# Patient Record
Sex: Male | Born: 1950 | Race: White | Hispanic: No | Marital: Married | Smoking: Never smoker
Health system: Southern US, Community
[De-identification: ages and names within clinical notes are randomized; demographics above are authoritative.]

## PROBLEM LIST (undated history)

## (undated) DIAGNOSIS — N138 Other obstructive and reflux uropathy: Secondary | ICD-10-CM

## (undated) DIAGNOSIS — M1990 Unspecified osteoarthritis, unspecified site: Secondary | ICD-10-CM

## (undated) DIAGNOSIS — M109 Gout, unspecified: Secondary | ICD-10-CM

## (undated) DIAGNOSIS — R972 Elevated prostate specific antigen [PSA]: Secondary | ICD-10-CM

## (undated) DIAGNOSIS — K219 Gastro-esophageal reflux disease without esophagitis: Secondary | ICD-10-CM

## (undated) DIAGNOSIS — M159 Polyosteoarthritis, unspecified: Secondary | ICD-10-CM

## (undated) DIAGNOSIS — N401 Enlarged prostate with lower urinary tract symptoms: Secondary | ICD-10-CM

## (undated) HISTORY — DX: Other obstructive and reflux uropathy: N13.8

## (undated) HISTORY — DX: Gout, unspecified: M10.9

## (undated) HISTORY — DX: Gastro-esophageal reflux disease without esophagitis: K21.9

## (undated) HISTORY — DX: Polyosteoarthritis, unspecified: M15.9

## (undated) HISTORY — PX: CATARACT EXTRACTION: SUR2

## (undated) HISTORY — DX: Unspecified osteoarthritis, unspecified site: M19.90

## (undated) HISTORY — PX: ROTATOR CUFF REPAIR: SHX139

## (undated) HISTORY — DX: Elevated prostate specific antigen (PSA): R97.20

## (undated) HISTORY — DX: Benign prostatic hyperplasia with lower urinary tract symptoms: N13.8

## (undated) HISTORY — PX: APPENDECTOMY: SHX54

## (undated) HISTORY — DX: Other obstructive and reflux uropathy: N40.1

---

## 1970-10-05 HISTORY — PX: SHOULDER SURGERY: SHX246

## 2014-08-02 LAB — HM COLONOSCOPY

## 2014-10-05 HISTORY — PX: EYE SURGERY: SHX253

## 2015-10-31 DIAGNOSIS — H31092 Other chorioretinal scars, left eye: Secondary | ICD-10-CM | POA: Diagnosis not present

## 2015-10-31 DIAGNOSIS — H35413 Lattice degeneration of retina, bilateral: Secondary | ICD-10-CM | POA: Diagnosis not present

## 2015-10-31 DIAGNOSIS — H43811 Vitreous degeneration, right eye: Secondary | ICD-10-CM | POA: Diagnosis not present

## 2015-11-12 DIAGNOSIS — H2513 Age-related nuclear cataract, bilateral: Secondary | ICD-10-CM | POA: Diagnosis not present

## 2015-11-15 DIAGNOSIS — H21562 Pupillary abnormality, left eye: Secondary | ICD-10-CM | POA: Diagnosis not present

## 2015-11-15 DIAGNOSIS — H1031 Unspecified acute conjunctivitis, right eye: Secondary | ICD-10-CM | POA: Diagnosis not present

## 2015-12-12 DIAGNOSIS — H2513 Age-related nuclear cataract, bilateral: Secondary | ICD-10-CM | POA: Diagnosis not present

## 2015-12-20 DIAGNOSIS — Z9889 Other specified postprocedural states: Secondary | ICD-10-CM | POA: Diagnosis not present

## 2015-12-20 DIAGNOSIS — Z79899 Other long term (current) drug therapy: Secondary | ICD-10-CM | POA: Diagnosis not present

## 2015-12-20 DIAGNOSIS — H269 Unspecified cataract: Secondary | ICD-10-CM | POA: Diagnosis not present

## 2015-12-20 DIAGNOSIS — H2512 Age-related nuclear cataract, left eye: Secondary | ICD-10-CM | POA: Diagnosis not present

## 2015-12-23 DIAGNOSIS — Z961 Presence of intraocular lens: Secondary | ICD-10-CM | POA: Diagnosis not present

## 2015-12-23 DIAGNOSIS — H21562 Pupillary abnormality, left eye: Secondary | ICD-10-CM | POA: Diagnosis not present

## 2015-12-23 DIAGNOSIS — H33312 Horseshoe tear of retina without detachment, left eye: Secondary | ICD-10-CM | POA: Diagnosis not present

## 2015-12-23 DIAGNOSIS — H40013 Open angle with borderline findings, low risk, bilateral: Secondary | ICD-10-CM | POA: Diagnosis not present

## 2016-01-03 DIAGNOSIS — H40013 Open angle with borderline findings, low risk, bilateral: Secondary | ICD-10-CM | POA: Diagnosis not present

## 2016-01-03 DIAGNOSIS — Z961 Presence of intraocular lens: Secondary | ICD-10-CM | POA: Diagnosis not present

## 2016-01-03 DIAGNOSIS — H33312 Horseshoe tear of retina without detachment, left eye: Secondary | ICD-10-CM | POA: Diagnosis not present

## 2016-01-03 DIAGNOSIS — H21562 Pupillary abnormality, left eye: Secondary | ICD-10-CM | POA: Diagnosis not present

## 2016-03-25 DIAGNOSIS — L218 Other seborrheic dermatitis: Secondary | ICD-10-CM | POA: Diagnosis not present

## 2016-04-06 DIAGNOSIS — M7661 Achilles tendinitis, right leg: Secondary | ICD-10-CM | POA: Diagnosis not present

## 2016-04-10 DIAGNOSIS — M7062 Trochanteric bursitis, left hip: Secondary | ICD-10-CM | POA: Diagnosis not present

## 2016-04-30 DIAGNOSIS — H35372 Puckering of macula, left eye: Secondary | ICD-10-CM | POA: Diagnosis not present

## 2016-04-30 DIAGNOSIS — H43811 Vitreous degeneration, right eye: Secondary | ICD-10-CM | POA: Diagnosis not present

## 2016-04-30 DIAGNOSIS — H59032 Cystoid macular edema following cataract surgery, left eye: Secondary | ICD-10-CM | POA: Diagnosis not present

## 2016-04-30 DIAGNOSIS — H35413 Lattice degeneration of retina, bilateral: Secondary | ICD-10-CM | POA: Diagnosis not present

## 2016-04-30 DIAGNOSIS — H31092 Other chorioretinal scars, left eye: Secondary | ICD-10-CM | POA: Diagnosis not present

## 2016-05-01 DIAGNOSIS — R972 Elevated prostate specific antigen [PSA]: Secondary | ICD-10-CM | POA: Diagnosis not present

## 2016-05-12 DIAGNOSIS — N401 Enlarged prostate with lower urinary tract symptoms: Secondary | ICD-10-CM | POA: Diagnosis not present

## 2016-05-12 DIAGNOSIS — R972 Elevated prostate specific antigen [PSA]: Secondary | ICD-10-CM | POA: Diagnosis not present

## 2016-05-12 DIAGNOSIS — N5201 Erectile dysfunction due to arterial insufficiency: Secondary | ICD-10-CM | POA: Diagnosis not present

## 2016-06-04 DIAGNOSIS — H31092 Other chorioretinal scars, left eye: Secondary | ICD-10-CM | POA: Diagnosis not present

## 2016-06-04 DIAGNOSIS — H35372 Puckering of macula, left eye: Secondary | ICD-10-CM | POA: Diagnosis not present

## 2016-06-04 DIAGNOSIS — H59032 Cystoid macular edema following cataract surgery, left eye: Secondary | ICD-10-CM | POA: Diagnosis not present

## 2016-06-04 DIAGNOSIS — H43811 Vitreous degeneration, right eye: Secondary | ICD-10-CM | POA: Diagnosis not present

## 2016-06-04 DIAGNOSIS — H35413 Lattice degeneration of retina, bilateral: Secondary | ICD-10-CM | POA: Diagnosis not present

## 2016-07-27 DIAGNOSIS — M150 Primary generalized (osteo)arthritis: Secondary | ICD-10-CM | POA: Diagnosis not present

## 2016-07-27 DIAGNOSIS — M25552 Pain in left hip: Secondary | ICD-10-CM | POA: Diagnosis not present

## 2016-07-27 DIAGNOSIS — M1000 Idiopathic gout, unspecified site: Secondary | ICD-10-CM | POA: Diagnosis not present

## 2016-09-01 DIAGNOSIS — H31092 Other chorioretinal scars, left eye: Secondary | ICD-10-CM | POA: Diagnosis not present

## 2016-09-01 DIAGNOSIS — H35372 Puckering of macula, left eye: Secondary | ICD-10-CM | POA: Diagnosis not present

## 2016-09-01 DIAGNOSIS — H43811 Vitreous degeneration, right eye: Secondary | ICD-10-CM | POA: Diagnosis not present

## 2016-09-01 DIAGNOSIS — H35413 Lattice degeneration of retina, bilateral: Secondary | ICD-10-CM | POA: Diagnosis not present

## 2016-09-21 DIAGNOSIS — D649 Anemia, unspecified: Secondary | ICD-10-CM | POA: Diagnosis not present

## 2016-09-21 DIAGNOSIS — Z0000 Encounter for general adult medical examination without abnormal findings: Secondary | ICD-10-CM | POA: Diagnosis not present

## 2016-09-21 DIAGNOSIS — R972 Elevated prostate specific antigen [PSA]: Secondary | ICD-10-CM | POA: Diagnosis not present

## 2016-10-02 DIAGNOSIS — Z0000 Encounter for general adult medical examination without abnormal findings: Secondary | ICD-10-CM | POA: Diagnosis not present

## 2016-10-02 DIAGNOSIS — N400 Enlarged prostate without lower urinary tract symptoms: Secondary | ICD-10-CM | POA: Diagnosis not present

## 2016-10-02 DIAGNOSIS — R37 Sexual dysfunction, unspecified: Secondary | ICD-10-CM | POA: Diagnosis not present

## 2016-10-02 DIAGNOSIS — D509 Iron deficiency anemia, unspecified: Secondary | ICD-10-CM | POA: Diagnosis not present

## 2016-10-02 DIAGNOSIS — M109 Gout, unspecified: Secondary | ICD-10-CM | POA: Diagnosis not present

## 2016-10-02 DIAGNOSIS — R972 Elevated prostate specific antigen [PSA]: Secondary | ICD-10-CM | POA: Diagnosis not present

## 2016-10-02 DIAGNOSIS — Z23 Encounter for immunization: Secondary | ICD-10-CM | POA: Diagnosis not present

## 2016-10-29 DIAGNOSIS — D509 Iron deficiency anemia, unspecified: Secondary | ICD-10-CM | POA: Diagnosis not present

## 2017-03-04 DIAGNOSIS — H35372 Puckering of macula, left eye: Secondary | ICD-10-CM | POA: Diagnosis not present

## 2017-03-04 DIAGNOSIS — H35352 Cystoid macular degeneration, left eye: Secondary | ICD-10-CM | POA: Diagnosis not present

## 2017-04-27 DIAGNOSIS — M19012 Primary osteoarthritis, left shoulder: Secondary | ICD-10-CM | POA: Diagnosis not present

## 2017-04-27 DIAGNOSIS — M25512 Pain in left shoulder: Secondary | ICD-10-CM | POA: Diagnosis not present

## 2017-04-27 DIAGNOSIS — M7522 Bicipital tendinitis, left shoulder: Secondary | ICD-10-CM | POA: Diagnosis not present

## 2017-05-04 DIAGNOSIS — M7062 Trochanteric bursitis, left hip: Secondary | ICD-10-CM | POA: Diagnosis not present

## 2017-05-04 DIAGNOSIS — M25552 Pain in left hip: Secondary | ICD-10-CM | POA: Diagnosis not present

## 2017-05-04 DIAGNOSIS — M6281 Muscle weakness (generalized): Secondary | ICD-10-CM | POA: Diagnosis not present

## 2017-05-11 DIAGNOSIS — M25552 Pain in left hip: Secondary | ICD-10-CM | POA: Diagnosis not present

## 2017-05-11 DIAGNOSIS — N401 Enlarged prostate with lower urinary tract symptoms: Secondary | ICD-10-CM | POA: Diagnosis not present

## 2017-05-11 DIAGNOSIS — N5201 Erectile dysfunction due to arterial insufficiency: Secondary | ICD-10-CM | POA: Diagnosis not present

## 2017-05-11 DIAGNOSIS — M7062 Trochanteric bursitis, left hip: Secondary | ICD-10-CM | POA: Diagnosis not present

## 2017-05-11 DIAGNOSIS — M6281 Muscle weakness (generalized): Secondary | ICD-10-CM | POA: Diagnosis not present

## 2017-05-11 DIAGNOSIS — R972 Elevated prostate specific antigen [PSA]: Secondary | ICD-10-CM | POA: Diagnosis not present

## 2017-05-13 DIAGNOSIS — M7062 Trochanteric bursitis, left hip: Secondary | ICD-10-CM | POA: Diagnosis not present

## 2017-05-13 DIAGNOSIS — M25552 Pain in left hip: Secondary | ICD-10-CM | POA: Diagnosis not present

## 2017-05-13 DIAGNOSIS — M6281 Muscle weakness (generalized): Secondary | ICD-10-CM | POA: Diagnosis not present

## 2017-05-18 DIAGNOSIS — M6281 Muscle weakness (generalized): Secondary | ICD-10-CM | POA: Diagnosis not present

## 2017-05-18 DIAGNOSIS — M25552 Pain in left hip: Secondary | ICD-10-CM | POA: Diagnosis not present

## 2017-05-18 DIAGNOSIS — M7062 Trochanteric bursitis, left hip: Secondary | ICD-10-CM | POA: Diagnosis not present

## 2017-05-20 DIAGNOSIS — M6281 Muscle weakness (generalized): Secondary | ICD-10-CM | POA: Diagnosis not present

## 2017-05-20 DIAGNOSIS — M7062 Trochanteric bursitis, left hip: Secondary | ICD-10-CM | POA: Diagnosis not present

## 2017-05-20 DIAGNOSIS — M25552 Pain in left hip: Secondary | ICD-10-CM | POA: Diagnosis not present

## 2017-06-01 DIAGNOSIS — M25552 Pain in left hip: Secondary | ICD-10-CM | POA: Diagnosis not present

## 2017-06-01 DIAGNOSIS — M6281 Muscle weakness (generalized): Secondary | ICD-10-CM | POA: Diagnosis not present

## 2017-06-01 DIAGNOSIS — M7062 Trochanteric bursitis, left hip: Secondary | ICD-10-CM | POA: Diagnosis not present

## 2017-06-04 DIAGNOSIS — M6281 Muscle weakness (generalized): Secondary | ICD-10-CM | POA: Diagnosis not present

## 2017-06-04 DIAGNOSIS — M25552 Pain in left hip: Secondary | ICD-10-CM | POA: Diagnosis not present

## 2017-06-04 DIAGNOSIS — M7062 Trochanteric bursitis, left hip: Secondary | ICD-10-CM | POA: Diagnosis not present

## 2017-06-08 DIAGNOSIS — M7062 Trochanteric bursitis, left hip: Secondary | ICD-10-CM | POA: Diagnosis not present

## 2017-06-08 DIAGNOSIS — M25552 Pain in left hip: Secondary | ICD-10-CM | POA: Diagnosis not present

## 2017-06-08 DIAGNOSIS — M6281 Muscle weakness (generalized): Secondary | ICD-10-CM | POA: Diagnosis not present

## 2017-06-10 DIAGNOSIS — M6281 Muscle weakness (generalized): Secondary | ICD-10-CM | POA: Diagnosis not present

## 2017-06-10 DIAGNOSIS — M7062 Trochanteric bursitis, left hip: Secondary | ICD-10-CM | POA: Diagnosis not present

## 2017-06-10 DIAGNOSIS — M25552 Pain in left hip: Secondary | ICD-10-CM | POA: Diagnosis not present

## 2017-06-15 ENCOUNTER — Encounter: Payer: Self-pay | Admitting: Internal Medicine

## 2017-06-15 ENCOUNTER — Ambulatory Visit (INDEPENDENT_AMBULATORY_CARE_PROVIDER_SITE_OTHER): Payer: Medicare Other | Admitting: Internal Medicine

## 2017-06-15 VITALS — BP 118/70 | HR 71 | Temp 98.2°F | Ht 71.25 in | Wt 180.2 lb

## 2017-06-15 DIAGNOSIS — M7062 Trochanteric bursitis, left hip: Secondary | ICD-10-CM | POA: Diagnosis not present

## 2017-06-15 DIAGNOSIS — N138 Other obstructive and reflux uropathy: Secondary | ICD-10-CM | POA: Diagnosis not present

## 2017-06-15 DIAGNOSIS — M159 Polyosteoarthritis, unspecified: Secondary | ICD-10-CM | POA: Insufficient documentation

## 2017-06-15 DIAGNOSIS — Z7189 Other specified counseling: Secondary | ICD-10-CM | POA: Diagnosis not present

## 2017-06-15 DIAGNOSIS — N401 Enlarged prostate with lower urinary tract symptoms: Secondary | ICD-10-CM

## 2017-06-15 DIAGNOSIS — R972 Elevated prostate specific antigen [PSA]: Secondary | ICD-10-CM | POA: Diagnosis not present

## 2017-06-15 DIAGNOSIS — M25552 Pain in left hip: Secondary | ICD-10-CM | POA: Diagnosis not present

## 2017-06-15 DIAGNOSIS — M109 Gout, unspecified: Secondary | ICD-10-CM

## 2017-06-15 DIAGNOSIS — M6281 Muscle weakness (generalized): Secondary | ICD-10-CM | POA: Diagnosis not present

## 2017-06-15 NOTE — Assessment & Plan Note (Addendum)
Does okay with the tamsulosin Takes sildenafil 20mg  daily as well--this has helped (not for sex)

## 2017-06-15 NOTE — Assessment & Plan Note (Signed)
Questionable episode once No problems since being on the probenicid

## 2017-06-15 NOTE — Progress Notes (Signed)
Subjective:    Patient ID: Craig Jackson, male    DOB: [birth year], 66 y.o.   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   No current facility-administered medications on file prior to visit.     No Known Allergies  Past Medical History:  Diagnosis Date  . Arthritis   . BPH with obstruction/lower urinary tract symptoms   . Elevated PSA   . 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 Gout     Past Surgical History:  Procedure Laterality Date  . APPENDECTOMY    . CATARACT EXTRACTION    . EYE SURGERY  2016   detached retina  . ROTATOR CUFF REPAIR    . SHOULDER SURGERY Left 1972   separation    Family History  Problem Relation Age of Onset  . Arthritis Mother   . Depression Sister     Social History   Social History  . Marital status: Married    Spouse name: N/A  . Number of children: 1  . Years of education: N/A   Occupational History  . Materials engineer     retired   Social History Main Topics  . Smoking status: Never Smoker  . Smokeless tobacco: Current User    Types: Chew     Comment: only occasionally  . Alcohol use 1.2 oz/week    2 Cans of beer per week  . Drug use: No  . Sexual activity: No   Other Topics Concern  . Not on file   Social  History Narrative   1 daughter---local   State graduate      Has living will   Wife is health care POA--daughter is alternate   Would accept resuscitation   Not sure about feeding tube   Review of Systems  Constitutional: Negative for fatigue and unexpected weight change.  HENT: Negative for hearing loss and tinnitus.   Eyes: Positive for visual disturbance.  Respiratory: Negative for cough, chest tightness and shortness of breath.   Cardiovascular: Negative for chest pain, palpitations and leg swelling.  Gastrointestinal: Negative for blood in stool and constipation.       Colon done 2015--was negative  Endocrine: Negative for polydipsia and polyuria.  Genitourinary: Positive for difficulty urinating and urgency.       Mild incontinence--works on Kegel (no pad yet)  Musculoskeletal: Positive for arthralgias. Negative for joint swelling.  Skin: Negative for rash.       No suspicious lesions Does see dermatologist  Allergic/Immunologic: Negative for environmental allergies and immunocompromised state.  Neurological: Negative for dizziness, syncope, light-headedness and headaches.  Psychiatric/Behavioral: Negative for dysphoric mood and sleep disturbance. The patient is not nervous/anxious.        Objective:   Physical Exam  Constitutional: He appears well-nourished. No distress.  HENT:  Mouth/Throat: Oropharynx is clear  and moist. No oropharyngeal exudate.  Neck: No thyromegaly present.  Cardiovascular: Normal rate, regular rhythm, normal heart sounds and intact distal pulses.  Exam reveals no gallop.   No murmur heard. Pulmonary/Chest: Effort normal and breath sounds normal. No respiratory distress. He has no wheezes. He has no rales.  Abdominal: Soft. He exhibits no distension. There is no tenderness. There is no rebound and no guarding.  Musculoskeletal: He exhibits no edema or tenderness.  Lymphadenopathy:    He has no cervical adenopathy.  Skin: No rash noted.    Psychiatric: He has a normal mood and affect. His behavior is normal.          Assessment & Plan:

## 2017-06-15 NOTE — Assessment & Plan Note (Signed)
Lingering around 5 for some years Will recheck in December at his yearly

## 2017-06-15 NOTE — Assessment & Plan Note (Signed)
See social history 

## 2017-06-15 NOTE — Assessment & Plan Note (Signed)
Mild in hands Uses the fish oil and chondroitin with success

## 2017-06-21 ENCOUNTER — Telehealth: Payer: Self-pay | Admitting: Internal Medicine

## 2017-06-21 NOTE — Telephone Encounter (Signed)
Pt is requesting labs prior to cpe. He said he has always done labs prior and prefers to it this way. Cpe is sch 10/2017.

## 2017-06-21 NOTE — Telephone Encounter (Signed)
Okay to schedule lab appt 1-2 weeks before the 1/19 appt  I will put in the order later

## 2017-06-22 DIAGNOSIS — M7062 Trochanteric bursitis, left hip: Secondary | ICD-10-CM | POA: Diagnosis not present

## 2017-06-22 DIAGNOSIS — M25552 Pain in left hip: Secondary | ICD-10-CM | POA: Diagnosis not present

## 2017-06-22 DIAGNOSIS — M6281 Muscle weakness (generalized): Secondary | ICD-10-CM | POA: Diagnosis not present

## 2017-07-27 DIAGNOSIS — M25512 Pain in left shoulder: Secondary | ICD-10-CM | POA: Diagnosis not present

## 2017-07-27 DIAGNOSIS — M75111 Incomplete rotator cuff tear or rupture of right shoulder, not specified as traumatic: Secondary | ICD-10-CM | POA: Diagnosis not present

## 2017-07-27 DIAGNOSIS — M75112 Incomplete rotator cuff tear or rupture of left shoulder, not specified as traumatic: Secondary | ICD-10-CM | POA: Diagnosis not present

## 2017-08-04 DIAGNOSIS — Z23 Encounter for immunization: Secondary | ICD-10-CM | POA: Diagnosis not present

## 2017-08-12 DIAGNOSIS — M25612 Stiffness of left shoulder, not elsewhere classified: Secondary | ICD-10-CM | POA: Diagnosis not present

## 2017-08-12 DIAGNOSIS — M25611 Stiffness of right shoulder, not elsewhere classified: Secondary | ICD-10-CM | POA: Diagnosis not present

## 2017-08-12 DIAGNOSIS — M25512 Pain in left shoulder: Secondary | ICD-10-CM | POA: Diagnosis not present

## 2017-08-12 DIAGNOSIS — M25511 Pain in right shoulder: Secondary | ICD-10-CM | POA: Diagnosis not present

## 2017-08-12 DIAGNOSIS — M6281 Muscle weakness (generalized): Secondary | ICD-10-CM | POA: Diagnosis not present

## 2017-08-17 DIAGNOSIS — M25511 Pain in right shoulder: Secondary | ICD-10-CM | POA: Diagnosis not present

## 2017-08-17 DIAGNOSIS — M6281 Muscle weakness (generalized): Secondary | ICD-10-CM | POA: Diagnosis not present

## 2017-08-17 DIAGNOSIS — M25612 Stiffness of left shoulder, not elsewhere classified: Secondary | ICD-10-CM | POA: Diagnosis not present

## 2017-08-17 DIAGNOSIS — M25611 Stiffness of right shoulder, not elsewhere classified: Secondary | ICD-10-CM | POA: Diagnosis not present

## 2017-08-17 DIAGNOSIS — M25512 Pain in left shoulder: Secondary | ICD-10-CM | POA: Diagnosis not present

## 2017-08-19 DIAGNOSIS — M25512 Pain in left shoulder: Secondary | ICD-10-CM | POA: Diagnosis not present

## 2017-08-19 DIAGNOSIS — M25511 Pain in right shoulder: Secondary | ICD-10-CM | POA: Diagnosis not present

## 2017-08-19 DIAGNOSIS — M25611 Stiffness of right shoulder, not elsewhere classified: Secondary | ICD-10-CM | POA: Diagnosis not present

## 2017-08-19 DIAGNOSIS — M25612 Stiffness of left shoulder, not elsewhere classified: Secondary | ICD-10-CM | POA: Diagnosis not present

## 2017-08-19 DIAGNOSIS — M6281 Muscle weakness (generalized): Secondary | ICD-10-CM | POA: Diagnosis not present

## 2017-08-25 DIAGNOSIS — M25612 Stiffness of left shoulder, not elsewhere classified: Secondary | ICD-10-CM | POA: Diagnosis not present

## 2017-08-25 DIAGNOSIS — M25611 Stiffness of right shoulder, not elsewhere classified: Secondary | ICD-10-CM | POA: Diagnosis not present

## 2017-08-25 DIAGNOSIS — M25511 Pain in right shoulder: Secondary | ICD-10-CM | POA: Diagnosis not present

## 2017-08-25 DIAGNOSIS — M25512 Pain in left shoulder: Secondary | ICD-10-CM | POA: Diagnosis not present

## 2017-08-25 DIAGNOSIS — M6281 Muscle weakness (generalized): Secondary | ICD-10-CM | POA: Diagnosis not present

## 2017-08-31 DIAGNOSIS — M25511 Pain in right shoulder: Secondary | ICD-10-CM | POA: Diagnosis not present

## 2017-08-31 DIAGNOSIS — M25512 Pain in left shoulder: Secondary | ICD-10-CM | POA: Diagnosis not present

## 2017-08-31 DIAGNOSIS — M25612 Stiffness of left shoulder, not elsewhere classified: Secondary | ICD-10-CM | POA: Diagnosis not present

## 2017-08-31 DIAGNOSIS — M25611 Stiffness of right shoulder, not elsewhere classified: Secondary | ICD-10-CM | POA: Diagnosis not present

## 2017-09-02 DIAGNOSIS — M25612 Stiffness of left shoulder, not elsewhere classified: Secondary | ICD-10-CM | POA: Diagnosis not present

## 2017-09-02 DIAGNOSIS — M25611 Stiffness of right shoulder, not elsewhere classified: Secondary | ICD-10-CM | POA: Diagnosis not present

## 2017-09-02 DIAGNOSIS — M25511 Pain in right shoulder: Secondary | ICD-10-CM | POA: Diagnosis not present

## 2017-09-02 DIAGNOSIS — H35413 Lattice degeneration of retina, bilateral: Secondary | ICD-10-CM | POA: Diagnosis not present

## 2017-09-02 DIAGNOSIS — H3581 Retinal edema: Secondary | ICD-10-CM | POA: Diagnosis not present

## 2017-09-02 DIAGNOSIS — H35372 Puckering of macula, left eye: Secondary | ICD-10-CM | POA: Diagnosis not present

## 2017-09-02 DIAGNOSIS — M25512 Pain in left shoulder: Secondary | ICD-10-CM | POA: Diagnosis not present

## 2017-09-07 DIAGNOSIS — M6281 Muscle weakness (generalized): Secondary | ICD-10-CM | POA: Diagnosis not present

## 2017-09-07 DIAGNOSIS — M25612 Stiffness of left shoulder, not elsewhere classified: Secondary | ICD-10-CM | POA: Diagnosis not present

## 2017-09-07 DIAGNOSIS — M25511 Pain in right shoulder: Secondary | ICD-10-CM | POA: Diagnosis not present

## 2017-09-07 DIAGNOSIS — M25512 Pain in left shoulder: Secondary | ICD-10-CM | POA: Diagnosis not present

## 2017-09-07 DIAGNOSIS — M25611 Stiffness of right shoulder, not elsewhere classified: Secondary | ICD-10-CM | POA: Diagnosis not present

## 2017-09-09 DIAGNOSIS — M25611 Stiffness of right shoulder, not elsewhere classified: Secondary | ICD-10-CM | POA: Diagnosis not present

## 2017-09-09 DIAGNOSIS — M25511 Pain in right shoulder: Secondary | ICD-10-CM | POA: Diagnosis not present

## 2017-09-09 DIAGNOSIS — M25612 Stiffness of left shoulder, not elsewhere classified: Secondary | ICD-10-CM | POA: Diagnosis not present

## 2017-09-09 DIAGNOSIS — M6281 Muscle weakness (generalized): Secondary | ICD-10-CM | POA: Diagnosis not present

## 2017-09-09 DIAGNOSIS — M25512 Pain in left shoulder: Secondary | ICD-10-CM | POA: Diagnosis not present

## 2017-09-14 DIAGNOSIS — M25511 Pain in right shoulder: Secondary | ICD-10-CM | POA: Diagnosis not present

## 2017-09-14 DIAGNOSIS — M75111 Incomplete rotator cuff tear or rupture of right shoulder, not specified as traumatic: Secondary | ICD-10-CM | POA: Diagnosis not present

## 2017-09-16 DIAGNOSIS — M25612 Stiffness of left shoulder, not elsewhere classified: Secondary | ICD-10-CM | POA: Diagnosis not present

## 2017-09-16 DIAGNOSIS — M25512 Pain in left shoulder: Secondary | ICD-10-CM | POA: Diagnosis not present

## 2017-09-16 DIAGNOSIS — M25611 Stiffness of right shoulder, not elsewhere classified: Secondary | ICD-10-CM | POA: Diagnosis not present

## 2017-09-16 DIAGNOSIS — M25511 Pain in right shoulder: Secondary | ICD-10-CM | POA: Diagnosis not present

## 2017-09-16 DIAGNOSIS — M6281 Muscle weakness (generalized): Secondary | ICD-10-CM | POA: Diagnosis not present

## 2017-10-12 ENCOUNTER — Ambulatory Visit (INDEPENDENT_AMBULATORY_CARE_PROVIDER_SITE_OTHER): Payer: Medicare Other | Admitting: Internal Medicine

## 2017-10-12 ENCOUNTER — Encounter: Payer: Self-pay | Admitting: Internal Medicine

## 2017-10-12 VITALS — BP 126/78 | HR 57 | Temp 97.6°F | Ht 71.75 in | Wt 183.0 lb

## 2017-10-12 DIAGNOSIS — Z7189 Other specified counseling: Secondary | ICD-10-CM

## 2017-10-12 DIAGNOSIS — N138 Other obstructive and reflux uropathy: Secondary | ICD-10-CM | POA: Diagnosis not present

## 2017-10-12 DIAGNOSIS — M159 Polyosteoarthritis, unspecified: Secondary | ICD-10-CM | POA: Diagnosis not present

## 2017-10-12 DIAGNOSIS — Z0000 Encounter for general adult medical examination without abnormal findings: Secondary | ICD-10-CM | POA: Diagnosis not present

## 2017-10-12 DIAGNOSIS — N401 Enlarged prostate with lower urinary tract symptoms: Secondary | ICD-10-CM

## 2017-10-12 DIAGNOSIS — R972 Elevated prostate specific antigen [PSA]: Secondary | ICD-10-CM | POA: Diagnosis not present

## 2017-10-12 DIAGNOSIS — M109 Gout, unspecified: Secondary | ICD-10-CM

## 2017-10-12 LAB — COMPREHENSIVE METABOLIC PANEL
ALT: 13 U/L (ref 0–53)
AST: 11 U/L (ref 0–37)
Albumin: 4.3 g/dL (ref 3.5–5.2)
Alkaline Phosphatase: 63 U/L (ref 39–117)
BILIRUBIN TOTAL: 1.3 mg/dL — AB (ref 0.2–1.2)
BUN: 15 mg/dL (ref 6–23)
CALCIUM: 9.2 mg/dL (ref 8.4–10.5)
CHLORIDE: 104 meq/L (ref 96–112)
CO2: 29 mEq/L (ref 19–32)
CREATININE: 1.02 mg/dL (ref 0.40–1.50)
GFR: 77.19 mL/min (ref >60.00)
GLUCOSE: 93 mg/dL (ref 70–99)
Potassium: 3.9 mEq/L (ref 3.5–5.1)
Sodium: 140 mEq/L (ref 135–145)
Total Protein: 6.4 g/dL (ref 6.0–8.3)

## 2017-10-12 LAB — URIC ACID: Uric Acid, Serum: 5.3 mg/dL (ref 4.0–7.8)

## 2017-10-12 LAB — CBC
HCT: 43.1 % (ref 39.0–52.0)
Hemoglobin: 14 g/dL (ref 13.0–17.0)
MCHC: 32.6 g/dL (ref 30.0–36.0)
MCV: 94.4 fl (ref 78.0–100.0)
PLATELETS: 236 10*3/uL (ref 150.0–400.0)
RBC: 4.57 Mil/uL (ref 4.22–5.81)
RDW: 15.2 % (ref 11.5–15.5)
WBC: 5.5 10*3/uL (ref 4.0–10.5)

## 2017-10-12 LAB — PSA: PSA: 4.58 ng/mL — AB (ref 0.10–4.00)

## 2017-10-12 MED ORDER — SILDENAFIL CITRATE 20 MG PO TABS: ORAL_TABLET | ORAL | 11 refills | Status: DC

## 2017-10-12 NOTE — Assessment & Plan Note (Signed)
Voids okay with tamsulosin and sildenafil

## 2017-10-12 NOTE — Assessment & Plan Note (Signed)
As high as about 10 with prostatitis ~15 years ago More recently 5.5 Will recheck

## 2017-10-12 NOTE — Assessment & Plan Note (Signed)
See social history 

## 2017-10-12 NOTE — Progress Notes (Signed)
Subjective:    Patient ID: Craig Jackson, male    DOB: Feb 24, 1951, 67 y.o.   MRN: [id number]  HPI Here for Medicare wellness and follow up of chronic health conditions Reviewed form and advanced directives Reviewed other doctors No tobacco Will have 1-2 beers a day  No falls No depression or anhedonia Walks regularly and throws horseshoes daily Vision is okay-- keeps up with retinal specialist due to tear and detachment. Left eye still somewhat distorted Hearing is okay Independent with instrumental ADLs No memory problems  Has a few "aches and pains" Left hip piriformis PT via his orthopedist No meds except occasional aleve. Heat also helps Right shoulder trouble in past 2 months---cortisone shot helped  Did stop the probenicid No symptoms   Voiding well Continues on the flomax Also takes 1 sildenafil a day ---and this helps Nocturia x 1-2 at most Does want to recheck PSA  Current Outpatient Medications on File Prior to Visit  Medication Sig Dispense Refill  . CHONDROITIN SULFATE A PO Take 1,600 mg by mouth daily.    . diphenhydrAMINE (BENADRYL) 25 MG tablet Take 50 mg by mouth at bedtime.    . Fish Oil-Cholecalciferol (FISH OIL + D3 PO) Take by mouth.    . sildenafil (REVATIO) 20 MG tablet Take 20 mg by mouth daily.    . tamsulosin (FLOMAX) 0.4 MG CAPS capsule Take 0.4 mg by mouth daily.     No current facility-administered medications on file prior to visit.     No Known Allergies  Past Medical History:  Diagnosis Date  . Arthritis   . BPH with obstruction/lower urinary tract symptoms   . Elevated PSA   . 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 Gout     Past Surgical History:  Procedure Laterality Date  . APPENDECTOMY    . CATARACT EXTRACTION    . EYE SURGERY  2016   detached retina  . ROTATOR CUFF REPAIR    . SHOULDER SURGERY Left 1972   separation    Family History  Problem Relation Age of Onset  . Arthritis Mother    . Atrial fibrillation Father   . Depression Sister        from chronic pain  . Arthritis Sister   . Heart disease Neg Hx   . Diabetes Neg Hx   . Cancer Neg Hx     Social History   Socioeconomic History  . Marital status: Married    Spouse name: Not on file  . Number of children: 1  . Years of education: Not on file  . Highest education level: Not on file  Social Needs  . Financial resource strain: Not on file  . Food insecurity - worry: Not on file  . Food insecurity - inability: Not on file  . Transportation needs - medical: Not on file  . Transportation needs - non-medical: Not on file  Occupational History  . Occupation: Materials engineer    Comment: retired  Tobacco Use  . Smoking status: Never Smoker  . Smokeless tobacco: Former Systems developer    Types: Chew  . Tobacco comment: only occasionally  Substance and Sexual Activity  . Alcohol use: Yes    Alcohol/week: 1.2 oz    Types: 2 Cans of beer per week  . Drug use: No  . Sexual activity: No  Other Topics Concern  . Not on file  Social History Narrative   1 daughter---local   State graduate  Has living will   Wife is health care POA--daughter is alternate   Would accept resuscitation   Not sure about feeding tube   Review of Systems  Appetite is good Weight stable Sleeps okay Wears seat belt Teeth okay--- keeps up with dentist Bowels fine--no blood No skin lesions--past dermatologist but needs someone locally (has some moles) No heartburn or dysphagia    Objective:   Physical Exam  Constitutional: He is oriented to person, place, and time. He appears well-developed. No distress.  HENT:  Mouth/Throat: Oropharynx is clear and moist.  Neck: No thyromegaly present.  Cardiovascular: Normal rate, regular rhythm, normal heart sounds and intact distal pulses. Exam reveals no gallop.  No murmur heard. Pulmonary/Chest: Effort normal and breath sounds normal. No respiratory distress. He has no wheezes. He has  no rales.  Abdominal: Soft. He exhibits no distension. There is no tenderness.  Musculoskeletal: He exhibits no edema or tenderness.  Lymphadenopathy:    He has no cervical adenopathy.  Neurological: He is alert and oriented to person, place, and time.  President--- "Daisy Floro, Obama,  Bush" [phone] D-l-r-o-w Recall 2/3`  Skin: No rash noted. No erythema.  Psychiatric: He has a normal mood and affect. His behavior is normal.          Assessment & Plan:

## 2017-10-12 NOTE — Assessment & Plan Note (Signed)
Various spots Sees ortho prn Occasional meds

## 2017-10-12 NOTE — Patient Instructions (Signed)
Consider getting the shingrix vaccine at your pharmacy.

## 2017-10-12 NOTE — Assessment & Plan Note (Signed)
One questionable bout and known hyperuricemia No problems off probenicid Will check level

## 2017-10-12 NOTE — Progress Notes (Signed)
Hearing Screening   Method: Audiometry   125Hz  250Hz  500Hz  1000Hz  2000Hz  3000Hz  4000Hz  6000Hz  8000Hz   Right ear:   25 40 20  40    Left ear:   25 40 20  0    Vision Screening Comments: November 2018

## 2017-10-12 NOTE — Assessment & Plan Note (Signed)
I have personally reviewed the Medicare Annual Wellness questionnaire and have noted 1. The patient's medical and social history 2. Their use of alcohol, tobacco or illicit drugs 3. Their current medications and supplements 4. The patient's functional ability including ADL's, fall risks, home safety risks and hearing or visual             impairment. 5. Diet and physical activities 6. Evidence for depression or mood disorders  The patients weight, height, BMI and visual acuity have been recorded in the chart I have made referrals, counseling and provided education to the patient based review of the above and I have provided the pt with a written personalized care plan for preventive services.  I have provided you with a copy of your personalized plan for preventive services. Please take the time to review along with your updated medication list.  Discussed shingrix Colon due 10/25--he will get me a copy of the report (normal 2015) Discussed resistance training Yearly flu vaccine--had already Td due 2020

## 2017-10-13 ENCOUNTER — Encounter: Payer: Self-pay | Admitting: *Deleted

## 2017-10-29 DIAGNOSIS — N401 Enlarged prostate with lower urinary tract symptoms: Secondary | ICD-10-CM | POA: Diagnosis not present

## 2017-10-29 DIAGNOSIS — N5201 Erectile dysfunction due to arterial insufficiency: Secondary | ICD-10-CM | POA: Diagnosis not present

## 2017-11-03 ENCOUNTER — Other Ambulatory Visit: Payer: Self-pay | Admitting: Internal Medicine

## 2017-11-03 NOTE — Telephone Encounter (Signed)
Patient came by and said he was given a rx for Sildenafil.  Patient said he lost the rx.  Patient wants to know if he can pick up another rx.

## 2017-11-04 MED ORDER — SILDENAFIL CITRATE 20 MG PO TABS: ORAL_TABLET | ORAL | 11 refills | Status: DC

## 2017-11-04 NOTE — Telephone Encounter (Signed)
Okay to mail to him if he wants

## 2017-11-04 NOTE — Telephone Encounter (Signed)
Per shannon, she has spoken with pt and he will pickup Rx 2/1

## 2017-11-04 NOTE — Telephone Encounter (Signed)
Spoke to pt. He wants a printed rx to be able to shop around. I cannot print from the computer I am on.

## 2017-11-04 NOTE — Telephone Encounter (Signed)
Find out where he wants to fill it and send electronically (same as I wrote earlier this month)

## 2017-11-24 ENCOUNTER — Other Ambulatory Visit: Payer: Self-pay

## 2017-11-24 ENCOUNTER — Ambulatory Visit (INDEPENDENT_AMBULATORY_CARE_PROVIDER_SITE_OTHER)
Admission: RE | Admit: 2017-11-24 | Discharge: 2017-11-24 | Disposition: A | Discharge status: Home / Self Care | Payer: Medicare Other | Source: Ambulatory Visit | Attending: Family Medicine | Admitting: Family Medicine

## 2017-11-24 ENCOUNTER — Encounter: Payer: Self-pay | Admitting: Family Medicine

## 2017-11-24 ENCOUNTER — Ambulatory Visit (INDEPENDENT_AMBULATORY_CARE_PROVIDER_SITE_OTHER): Payer: Medicare Other | Admitting: Family Medicine

## 2017-11-24 ENCOUNTER — Encounter: Payer: Self-pay | Admitting: *Deleted

## 2017-11-24 VITALS — BP 110/60 | HR 73 | Temp 98.8°F | Ht 71.75 in | Wt 186.5 lb

## 2017-11-24 DIAGNOSIS — R05 Cough: Secondary | ICD-10-CM

## 2017-11-24 DIAGNOSIS — K219 Gastro-esophageal reflux disease without esophagitis: Secondary | ICD-10-CM | POA: Diagnosis not present

## 2017-11-24 DIAGNOSIS — R053 Chronic cough: Secondary | ICD-10-CM

## 2017-11-24 MED ORDER — OMEPRAZOLE 20 MG PO CPDR: 20.0000 mg | DELAYED_RELEASE_CAPSULE | Freq: Every day | ORAL | 5 refills | Status: DC

## 2017-11-24 NOTE — Progress Notes (Signed)
Dr. Frederico Hamman T. Ajaya Crutchfield, MD, [facility] [place], [address] Phone: [phone] Fax: [phone]  11/24/2017  Patient: Craig Jackson, MRN: [id number], DOB: Apr 01, 1951, 67 y.o.  Primary Physician:  Venia Carbon, MD   Chief Complaint  Patient presents with  . Cough    constant coughing-worse after over eating x years   Subjective:   Craig Jackson is a 67 y.o. very pleasant male patient who presents with the following:  Coughing for years to decades, and now has gotten worse. Woke up with phlegn and coughing. Ate a little bit of something. Overeating will trigger it atnd make it worse.  Cannot get rid of the phlegm and mucous.   Never smoked, never had lung disease.  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 1980's Pollution from work.   Never has had much by way allergies. No post-nasal drip.   Never lived in [place], etc. No TB exposure.  No jail use. No IV drugs.   No meds that would cause cough.  Past Medical History, Surgical History, Social History, Family History, Problem List, Medications, and Allergies have been reviewed and updated if relevant.  Patient Active Problem List   Diagnosis Date Noted  . Preventative health care 10/12/2017  . Advance directive discussed with patient 06/15/2017  . BPH with obstruction/lower urinary tract symptoms   . Gout   . Elevated PSA   . Generalized osteoarthritis of multiple sites     Past Medical History:  Diagnosis Date  . Arthritis   . BPH with obstruction/lower urinary tract symptoms   . Elevated PSA   . 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 Gout     Past Surgical History:  Procedure Laterality Date  . APPENDECTOMY    . CATARACT EXTRACTION    . EYE SURGERY  2016   detached retina  . ROTATOR CUFF REPAIR    . SHOULDER SURGERY Left 1972   separation    Social History   Socioeconomic History  . Marital status:  Married    Spouse name: Not on file  . Number of children: 1  . Years of education: Not on file  . Highest education level: Not on file  Social Needs  . Financial resource strain: Not on file  . Food insecurity - worry: Not on file  . Food insecurity - inability: Not on file  . Transportation needs - medical: Not on file  . Transportation needs - non-medical: Not on file  Occupational History  . Occupation: Materials engineer    Comment: retired  Tobacco Use  . Smoking status: Never Smoker  . Smokeless tobacco: Former Systems developer    Types: Chew  . Tobacco comment: only occasionally  Substance and Sexual Activity  . Alcohol use: Yes    Alcohol/week: 1.2 oz    Types: 2 Cans of beer per week  . Drug use: No  . Sexual activity: No  Other Topics Concern  . Not on file  Social History Narrative   1 daughter---local   State graduate      Has living will   Wife is health care POA--daughter is alternate   Would accept resuscitation   Not sure about feeding tube    Family History  Problem Relation Age of Onset  . Arthritis Mother   . Atrial fibrillation Father   . Depression Sister        from chronic pain  .  Arthritis Sister   . Heart disease Neg Hx   . Diabetes Neg Hx   . Cancer Neg Hx     No Known Allergies  Medication list reviewed and updated in full in [place].   GEN: No acute illnesses, no fevers, chills. GI: No n/v/d, eating normally Pulm: No SOB Interactive and getting along well at home.  Otherwise, ROS is as per the HPI.  Objective:   BP 110/60   Pulse 73   Temp 98.8 F (37.1 C) (Oral)   Ht 5' 11.75" (1.822 m)   Wt 186 lb 8 oz (84.6 kg)   SpO2 98%   BMI 25.47 kg/m   GEN: WDWN, NAD, Non-toxic, A & O x 3 HEENT: Atraumatic, Normocephalic. Neck supple. No masses, No LAD. Ears and Nose: No external deformity. CV: RRR, No M/G/R. No JVD. No thrill. No extra heart sounds. PULM: CTA B, no wheezes, crackles, rhonchi. No retractions. No resp.  distress. No accessory muscle use. ABD: S, NT, ND, + BS, No rebound, No HSM  EXTR: No c/c/e NEURO Normal gait.  PSYCH: Normally interactive. Conversant. Not depressed or anxious appearing.  Calm demeanor.   Laboratory and Imaging Data: Dg Chest 2 View  Result Date: 11/24/2017 CLINICAL DATA:  Chronic cough. EXAM: CHEST  2 VIEW COMPARISON:  None. FINDINGS: Lungs are clear. Heart size is normal. No pneumothorax or pleural effusion. No bony abnormality. IMPRESSION: Negative chest. Electronically Signed   By: Inge Rise M.D.   On: 11/24/2017 15:57     Assessment and Plan:   Chronic cough - Plan: DG Chest 2 View  Gastroesophageal reflux disease, esophagitis presence not specified  Most logical explanation for chronic cough in this case would be GERD induced. Trial of PPI for 1 month, and if not improving f/u with PCP.   Follow-up: No Follow-up on file.  Meds ordered this encounter  Medications  . omeprazole (PRILOSEC) 20 MG capsule    Sig: Take 1 capsule (20 mg total) by mouth daily.    Dispense:  30 capsule    Refill:  5   Orders Placed This Encounter  Procedures  . DG Chest 2 View    Signed,  Frederico Hamman T. Camauri Craton, MD   Allergies as of 11/24/2017   No Known Allergies     Medication List        Accurate as of 11/24/17 11:59 PM. Always use your most recent med list.          CHONDROITIN SULFATE A PO Take 1,600 mg by mouth daily.   diphenhydrAMINE 25 MG tablet Commonly known as:  BENADRYL Take 50 mg by mouth at bedtime.   FISH OIL + D3 PO Take by mouth.   omeprazole 20 MG capsule Commonly known as:  PRILOSEC Take 1 capsule (20 mg total) by mouth daily.   sildenafil 20 MG tablet Commonly known as:  REVATIO Take 3-5 tablets as needed prior to intercourse   tamsulosin 0.4 MG Caps capsule Commonly known as:  FLOMAX Take 0.4 mg by mouth daily.

## 2017-12-21 DIAGNOSIS — M75111 Incomplete rotator cuff tear or rupture of right shoulder, not specified as traumatic: Secondary | ICD-10-CM | POA: Diagnosis not present

## 2017-12-21 DIAGNOSIS — M7522 Bicipital tendinitis, left shoulder: Secondary | ICD-10-CM | POA: Diagnosis not present

## 2017-12-21 DIAGNOSIS — M25511 Pain in right shoulder: Secondary | ICD-10-CM | POA: Diagnosis not present

## 2017-12-28 ENCOUNTER — Other Ambulatory Visit: Payer: Self-pay | Admitting: Internal Medicine

## 2018-01-13 DIAGNOSIS — H26492 Other secondary cataract, left eye: Secondary | ICD-10-CM | POA: Diagnosis not present

## 2018-02-09 DIAGNOSIS — H26492 Other secondary cataract, left eye: Secondary | ICD-10-CM | POA: Diagnosis not present

## 2018-03-15 DIAGNOSIS — H43811 Vitreous degeneration, right eye: Secondary | ICD-10-CM | POA: Diagnosis not present

## 2018-03-31 DIAGNOSIS — M25511 Pain in right shoulder: Secondary | ICD-10-CM | POA: Diagnosis not present

## 2018-03-31 DIAGNOSIS — M25512 Pain in left shoulder: Secondary | ICD-10-CM | POA: Diagnosis not present

## 2018-03-31 DIAGNOSIS — M75112 Incomplete rotator cuff tear or rupture of left shoulder, not specified as traumatic: Secondary | ICD-10-CM | POA: Diagnosis not present

## 2018-03-31 DIAGNOSIS — M7521 Bicipital tendinitis, right shoulder: Secondary | ICD-10-CM | POA: Diagnosis not present

## 2018-05-10 DIAGNOSIS — D2271 Melanocytic nevi of right lower limb, including hip: Secondary | ICD-10-CM | POA: Diagnosis not present

## 2018-05-10 DIAGNOSIS — L821 Other seborrheic keratosis: Secondary | ICD-10-CM | POA: Diagnosis not present

## 2018-05-10 DIAGNOSIS — R972 Elevated prostate specific antigen [PSA]: Secondary | ICD-10-CM | POA: Diagnosis not present

## 2018-05-10 DIAGNOSIS — D225 Melanocytic nevi of trunk: Secondary | ICD-10-CM | POA: Diagnosis not present

## 2018-05-10 DIAGNOSIS — N5201 Erectile dysfunction due to arterial insufficiency: Secondary | ICD-10-CM | POA: Diagnosis not present

## 2018-05-10 DIAGNOSIS — L218 Other seborrheic dermatitis: Secondary | ICD-10-CM | POA: Diagnosis not present

## 2018-05-10 DIAGNOSIS — D2262 Melanocytic nevi of left upper limb, including shoulder: Secondary | ICD-10-CM | POA: Diagnosis not present

## 2018-05-10 DIAGNOSIS — D2272 Melanocytic nevi of left lower limb, including hip: Secondary | ICD-10-CM | POA: Diagnosis not present

## 2018-05-10 DIAGNOSIS — N401 Enlarged prostate with lower urinary tract symptoms: Secondary | ICD-10-CM | POA: Diagnosis not present

## 2018-05-10 DIAGNOSIS — D2261 Melanocytic nevi of right upper limb, including shoulder: Secondary | ICD-10-CM | POA: Diagnosis not present

## 2018-05-16 ENCOUNTER — Other Ambulatory Visit: Payer: Self-pay | Admitting: Family Medicine

## 2018-06-23 DIAGNOSIS — M7521 Bicipital tendinitis, right shoulder: Secondary | ICD-10-CM | POA: Diagnosis not present

## 2018-06-23 DIAGNOSIS — M75111 Incomplete rotator cuff tear or rupture of right shoulder, not specified as traumatic: Secondary | ICD-10-CM | POA: Diagnosis not present

## 2018-06-23 DIAGNOSIS — M25511 Pain in right shoulder: Secondary | ICD-10-CM | POA: Diagnosis not present

## 2018-07-14 DIAGNOSIS — M7581 Other shoulder lesions, right shoulder: Secondary | ICD-10-CM | POA: Diagnosis not present

## 2018-07-14 DIAGNOSIS — M25511 Pain in right shoulder: Secondary | ICD-10-CM | POA: Diagnosis not present

## 2018-07-14 DIAGNOSIS — M19011 Primary osteoarthritis, right shoulder: Secondary | ICD-10-CM | POA: Diagnosis not present

## 2018-07-14 DIAGNOSIS — M659 Synovitis and tenosynovitis, unspecified: Secondary | ICD-10-CM | POA: Diagnosis not present

## 2018-07-14 DIAGNOSIS — M75121 Complete rotator cuff tear or rupture of right shoulder, not specified as traumatic: Secondary | ICD-10-CM | POA: Diagnosis not present

## 2018-07-21 DIAGNOSIS — M25511 Pain in right shoulder: Secondary | ICD-10-CM | POA: Diagnosis not present

## 2018-07-21 DIAGNOSIS — M75121 Complete rotator cuff tear or rupture of right shoulder, not specified as traumatic: Secondary | ICD-10-CM | POA: Diagnosis not present

## 2018-07-21 DIAGNOSIS — M24111 Other articular cartilage disorders, right shoulder: Secondary | ICD-10-CM | POA: Diagnosis not present

## 2018-08-02 DIAGNOSIS — M24111 Other articular cartilage disorders, right shoulder: Secondary | ICD-10-CM | POA: Diagnosis not present

## 2018-08-02 DIAGNOSIS — M75121 Complete rotator cuff tear or rupture of right shoulder, not specified as traumatic: Secondary | ICD-10-CM | POA: Diagnosis not present

## 2018-08-02 DIAGNOSIS — M25511 Pain in right shoulder: Secondary | ICD-10-CM | POA: Diagnosis not present

## 2018-08-02 DIAGNOSIS — M75112 Incomplete rotator cuff tear or rupture of left shoulder, not specified as traumatic: Secondary | ICD-10-CM | POA: Diagnosis not present

## 2018-08-02 DIAGNOSIS — M25512 Pain in left shoulder: Secondary | ICD-10-CM | POA: Diagnosis not present

## 2018-08-03 DIAGNOSIS — M75101 Unspecified rotator cuff tear or rupture of right shoulder, not specified as traumatic: Secondary | ICD-10-CM | POA: Diagnosis not present

## 2018-08-03 DIAGNOSIS — M25511 Pain in right shoulder: Secondary | ICD-10-CM | POA: Diagnosis not present

## 2018-08-03 DIAGNOSIS — G8918 Other acute postprocedural pain: Secondary | ICD-10-CM | POA: Diagnosis not present

## 2018-08-03 DIAGNOSIS — M24111 Other articular cartilage disorders, right shoulder: Secondary | ICD-10-CM | POA: Diagnosis not present

## 2018-08-03 DIAGNOSIS — M75111 Incomplete rotator cuff tear or rupture of right shoulder, not specified as traumatic: Secondary | ICD-10-CM | POA: Diagnosis not present

## 2018-08-10 ENCOUNTER — Ambulatory Visit (INDEPENDENT_AMBULATORY_CARE_PROVIDER_SITE_OTHER): Payer: Medicare Other

## 2018-08-10 DIAGNOSIS — Z23 Encounter for immunization: Secondary | ICD-10-CM

## 2018-09-07 DIAGNOSIS — M6281 Muscle weakness (generalized): Secondary | ICD-10-CM | POA: Diagnosis not present

## 2018-09-07 DIAGNOSIS — R293 Abnormal posture: Secondary | ICD-10-CM | POA: Diagnosis not present

## 2018-09-07 DIAGNOSIS — M75121 Complete rotator cuff tear or rupture of right shoulder, not specified as traumatic: Secondary | ICD-10-CM | POA: Diagnosis not present

## 2018-09-07 DIAGNOSIS — M25511 Pain in right shoulder: Secondary | ICD-10-CM | POA: Diagnosis not present

## 2018-09-09 DIAGNOSIS — M25511 Pain in right shoulder: Secondary | ICD-10-CM | POA: Diagnosis not present

## 2018-09-09 DIAGNOSIS — M6281 Muscle weakness (generalized): Secondary | ICD-10-CM | POA: Diagnosis not present

## 2018-09-09 DIAGNOSIS — M75121 Complete rotator cuff tear or rupture of right shoulder, not specified as traumatic: Secondary | ICD-10-CM | POA: Diagnosis not present

## 2018-09-09 DIAGNOSIS — R293 Abnormal posture: Secondary | ICD-10-CM | POA: Diagnosis not present

## 2018-09-13 DIAGNOSIS — R293 Abnormal posture: Secondary | ICD-10-CM | POA: Diagnosis not present

## 2018-09-13 DIAGNOSIS — M25511 Pain in right shoulder: Secondary | ICD-10-CM | POA: Diagnosis not present

## 2018-09-13 DIAGNOSIS — M6281 Muscle weakness (generalized): Secondary | ICD-10-CM | POA: Diagnosis not present

## 2018-09-13 DIAGNOSIS — M75121 Complete rotator cuff tear or rupture of right shoulder, not specified as traumatic: Secondary | ICD-10-CM | POA: Diagnosis not present

## 2018-09-15 DIAGNOSIS — M75121 Complete rotator cuff tear or rupture of right shoulder, not specified as traumatic: Secondary | ICD-10-CM | POA: Diagnosis not present

## 2018-09-15 DIAGNOSIS — M25511 Pain in right shoulder: Secondary | ICD-10-CM | POA: Diagnosis not present

## 2018-09-15 DIAGNOSIS — M6281 Muscle weakness (generalized): Secondary | ICD-10-CM | POA: Diagnosis not present

## 2018-09-15 DIAGNOSIS — R293 Abnormal posture: Secondary | ICD-10-CM | POA: Diagnosis not present

## 2018-09-20 DIAGNOSIS — M75121 Complete rotator cuff tear or rupture of right shoulder, not specified as traumatic: Secondary | ICD-10-CM | POA: Diagnosis not present

## 2018-09-20 DIAGNOSIS — M25511 Pain in right shoulder: Secondary | ICD-10-CM | POA: Diagnosis not present

## 2018-09-20 DIAGNOSIS — M6281 Muscle weakness (generalized): Secondary | ICD-10-CM | POA: Diagnosis not present

## 2018-09-20 DIAGNOSIS — R293 Abnormal posture: Secondary | ICD-10-CM | POA: Diagnosis not present

## 2018-09-22 DIAGNOSIS — M75121 Complete rotator cuff tear or rupture of right shoulder, not specified as traumatic: Secondary | ICD-10-CM | POA: Diagnosis not present

## 2018-09-22 DIAGNOSIS — M25511 Pain in right shoulder: Secondary | ICD-10-CM | POA: Diagnosis not present

## 2018-09-22 DIAGNOSIS — R293 Abnormal posture: Secondary | ICD-10-CM | POA: Diagnosis not present

## 2018-09-22 DIAGNOSIS — M6281 Muscle weakness (generalized): Secondary | ICD-10-CM | POA: Diagnosis not present

## 2018-09-26 DIAGNOSIS — R293 Abnormal posture: Secondary | ICD-10-CM | POA: Diagnosis not present

## 2018-09-26 DIAGNOSIS — M75121 Complete rotator cuff tear or rupture of right shoulder, not specified as traumatic: Secondary | ICD-10-CM | POA: Diagnosis not present

## 2018-09-26 DIAGNOSIS — M6281 Muscle weakness (generalized): Secondary | ICD-10-CM | POA: Diagnosis not present

## 2018-09-26 DIAGNOSIS — M25511 Pain in right shoulder: Secondary | ICD-10-CM | POA: Diagnosis not present

## 2018-10-03 DIAGNOSIS — R293 Abnormal posture: Secondary | ICD-10-CM | POA: Diagnosis not present

## 2018-10-03 DIAGNOSIS — M75121 Complete rotator cuff tear or rupture of right shoulder, not specified as traumatic: Secondary | ICD-10-CM | POA: Diagnosis not present

## 2018-10-03 DIAGNOSIS — M25511 Pain in right shoulder: Secondary | ICD-10-CM | POA: Diagnosis not present

## 2018-10-03 DIAGNOSIS — M6281 Muscle weakness (generalized): Secondary | ICD-10-CM | POA: Diagnosis not present

## 2018-10-06 ENCOUNTER — Telehealth: Payer: Self-pay | Admitting: Internal Medicine

## 2018-10-06 DIAGNOSIS — N138 Other obstructive and reflux uropathy: Secondary | ICD-10-CM

## 2018-10-06 DIAGNOSIS — M25511 Pain in right shoulder: Secondary | ICD-10-CM | POA: Diagnosis not present

## 2018-10-06 DIAGNOSIS — R293 Abnormal posture: Secondary | ICD-10-CM | POA: Diagnosis not present

## 2018-10-06 DIAGNOSIS — M75121 Complete rotator cuff tear or rupture of right shoulder, not specified as traumatic: Secondary | ICD-10-CM | POA: Diagnosis not present

## 2018-10-06 DIAGNOSIS — M6281 Muscle weakness (generalized): Secondary | ICD-10-CM | POA: Diagnosis not present

## 2018-10-06 DIAGNOSIS — R972 Elevated prostate specific antigen [PSA]: Secondary | ICD-10-CM

## 2018-10-06 DIAGNOSIS — N401 Enlarged prostate with lower urinary tract symptoms: Secondary | ICD-10-CM

## 2018-10-06 NOTE — Telephone Encounter (Signed)
Patient is requesting if he can get his labs done before his appt b/c he wants to review his labs with the provider. If this is okay patient would like a call [phone] to schedule appt with the lab? Will Dr. Silvio Pate put in the orders for this or does he have to wait until appt?

## 2018-10-06 NOTE — Telephone Encounter (Signed)
Pt called and scheduled for 10/12/2018

## 2018-10-06 NOTE — Telephone Encounter (Signed)
Orders placed Okay to call him to set up time for blood draw---should not eat for 3-4 hours before

## 2018-10-11 DIAGNOSIS — M6281 Muscle weakness (generalized): Secondary | ICD-10-CM | POA: Diagnosis not present

## 2018-10-11 DIAGNOSIS — R293 Abnormal posture: Secondary | ICD-10-CM | POA: Diagnosis not present

## 2018-10-11 DIAGNOSIS — M25511 Pain in right shoulder: Secondary | ICD-10-CM | POA: Diagnosis not present

## 2018-10-11 DIAGNOSIS — M75121 Complete rotator cuff tear or rupture of right shoulder, not specified as traumatic: Secondary | ICD-10-CM | POA: Diagnosis not present

## 2018-10-12 ENCOUNTER — Other Ambulatory Visit (INDEPENDENT_AMBULATORY_CARE_PROVIDER_SITE_OTHER): Payer: Medicare Other

## 2018-10-12 DIAGNOSIS — R972 Elevated prostate specific antigen [PSA]: Secondary | ICD-10-CM

## 2018-10-12 DIAGNOSIS — N138 Other obstructive and reflux uropathy: Secondary | ICD-10-CM | POA: Diagnosis not present

## 2018-10-12 DIAGNOSIS — N401 Enlarged prostate with lower urinary tract symptoms: Secondary | ICD-10-CM | POA: Diagnosis not present

## 2018-10-12 LAB — COMPREHENSIVE METABOLIC PANEL
ALT: 9 U/L (ref 0–53)
AST: 10 U/L (ref 0–37)
Albumin: 4.1 g/dL (ref 3.5–5.2)
Alkaline Phosphatase: 77 U/L (ref 39–117)
BUN: 18 mg/dL (ref 6–23)
CO2: 29 mEq/L (ref 19–32)
Calcium: 9.8 mg/dL (ref 8.4–10.5)
Chloride: 105 mEq/L (ref 96–112)
Creatinine, Ser: 0.94 mg/dL (ref 0.40–1.50)
GFR: 84.57 mL/min (ref >60.00)
Glucose, Bld: 88 mg/dL (ref 70–99)
Potassium: 4.5 mEq/L (ref 3.5–5.1)
Sodium: 141 mEq/L (ref 135–145)
Total Bilirubin: 1 mg/dL (ref 0.2–1.2)
Total Protein: 7 g/dL (ref 6.0–8.3)

## 2018-10-12 LAB — CBC
HCT: 42.6 % (ref 39.0–52.0)
HEMOGLOBIN: 14 g/dL (ref 13.0–17.0)
MCHC: 32.9 g/dL (ref 30.0–36.0)
MCV: 84.3 fl (ref 78.0–100.0)
Platelets: 291 10*3/uL (ref 150.0–400.0)
RBC: 5.05 Mil/uL (ref 4.22–5.81)
RDW: 17.3 % — AB (ref 11.5–15.5)
WBC: 9 10*3/uL (ref 4.0–10.5)

## 2018-10-12 LAB — PSA: PSA: 5.62 ng/mL — ABNORMAL HIGH (ref 0.10–4.00)

## 2018-10-13 DIAGNOSIS — R293 Abnormal posture: Secondary | ICD-10-CM | POA: Diagnosis not present

## 2018-10-13 DIAGNOSIS — M25511 Pain in right shoulder: Secondary | ICD-10-CM | POA: Diagnosis not present

## 2018-10-13 DIAGNOSIS — M6281 Muscle weakness (generalized): Secondary | ICD-10-CM | POA: Diagnosis not present

## 2018-10-13 DIAGNOSIS — M75121 Complete rotator cuff tear or rupture of right shoulder, not specified as traumatic: Secondary | ICD-10-CM | POA: Diagnosis not present

## 2018-10-17 ENCOUNTER — Other Ambulatory Visit: Payer: Self-pay | Admitting: Internal Medicine

## 2018-10-17 ENCOUNTER — Encounter: Payer: Medicare Other | Admitting: Internal Medicine

## 2018-10-18 ENCOUNTER — Encounter: Payer: Self-pay | Admitting: Internal Medicine

## 2018-10-18 ENCOUNTER — Ambulatory Visit (INDEPENDENT_AMBULATORY_CARE_PROVIDER_SITE_OTHER): Payer: Medicare Other | Admitting: Internal Medicine

## 2018-10-18 DIAGNOSIS — M75121 Complete rotator cuff tear or rupture of right shoulder, not specified as traumatic: Secondary | ICD-10-CM | POA: Diagnosis not present

## 2018-10-18 DIAGNOSIS — K219 Gastro-esophageal reflux disease without esophagitis: Secondary | ICD-10-CM | POA: Insufficient documentation

## 2018-10-18 DIAGNOSIS — M6281 Muscle weakness (generalized): Secondary | ICD-10-CM | POA: Diagnosis not present

## 2018-10-18 DIAGNOSIS — R972 Elevated prostate specific antigen [PSA]: Secondary | ICD-10-CM | POA: Diagnosis not present

## 2018-10-18 DIAGNOSIS — N401 Enlarged prostate with lower urinary tract symptoms: Secondary | ICD-10-CM

## 2018-10-18 DIAGNOSIS — N138 Other obstructive and reflux uropathy: Secondary | ICD-10-CM

## 2018-10-18 DIAGNOSIS — M159 Polyosteoarthritis, unspecified: Secondary | ICD-10-CM | POA: Diagnosis not present

## 2018-10-18 DIAGNOSIS — Z0000 Encounter for general adult medical examination without abnormal findings: Secondary | ICD-10-CM | POA: Diagnosis not present

## 2018-10-18 DIAGNOSIS — Z7189 Other specified counseling: Secondary | ICD-10-CM

## 2018-10-18 DIAGNOSIS — R293 Abnormal posture: Secondary | ICD-10-CM | POA: Diagnosis not present

## 2018-10-18 DIAGNOSIS — M25511 Pain in right shoulder: Secondary | ICD-10-CM | POA: Diagnosis not present

## 2018-10-18 MED ORDER — TAMSULOSIN HCL 0.4 MG PO CAPS: 0.4000 mg | ORAL_CAPSULE | Freq: Two times a day (BID) | ORAL | 3 refills | Status: DC

## 2018-10-18 NOTE — Patient Instructions (Signed)
Please ask your pharmacist about the shingrix vaccine.

## 2018-10-18 NOTE — Assessment & Plan Note (Signed)
See social history 

## 2018-10-18 NOTE — Progress Notes (Signed)
Subjective:    Patient ID: Craig Jackson, male    DOB: 1950-12-25, 68 y.o.   MRN: [id number]  HPI  Here for Medicare wellness visit and follow up of chronic health conditions Reviewed form and advanced directives Reviewed other doctors Trying to exercise regularly--walking and some shoulder work Still enjoys 2 beers per day---mostly in the summer No tobacco No falls No depression or anhedonia Vision and hearing are okay Independent with instrumental ADLs No memory problems  Recovering from shoulder surgery on right Not able to repair supraspinatus--so he had to adjust Pain is gone Still doing therapy  Still having urinary problems Will have leakage if he waits too long--no pad though Nocturia x 1 Takes the sildenafil at times (not for sex but daily for the prostate)  No recurrence of gout  Current Outpatient Medications on File Prior to Visit  Medication Sig Dispense Refill  . CHONDROITIN SULFATE A PO Take 1,600 mg by mouth daily.    . diphenhydrAMINE (BENADRYL) 25 MG tablet Take 50 mg by mouth at bedtime.    . Fish Oil-Cholecalciferol (FISH OIL + D3 PO) Take by mouth.    Marland Kitchen omeprazole (PRILOSEC) 20 MG capsule TAKE 1 CAPSULE BY MOUTH EVERY DAY 90 capsule 1  . OVER THE COUNTER MEDICATION CBD Oil 2 times daily    . OVER THE COUNTER MEDICATION Super Prostate 2 tablets daily    . sildenafil (REVATIO) 20 MG tablet Take 3-5 tablets as needed prior to intercourse 50 tablet 11  . tamsulosin (FLOMAX) 0.4 MG CAPS capsule TAKE ONE CAPSULE BY MOUTH EVERY DAY 90 capsule 3   No current facility-administered medications on file prior to visit.     No Known Allergies  Past Medical History:  Diagnosis Date  . Arthritis   . BPH with obstruction/lower urinary tract symptoms   . Elevated PSA   . 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 Gout     Past Surgical History:  Procedure Laterality Date  . APPENDECTOMY    . CATARACT EXTRACTION    . EYE SURGERY   2016   detached retina  . ROTATOR CUFF REPAIR    . SHOULDER SURGERY Left 1972   separation    Family History  Problem Relation Age of Onset  . Arthritis Mother   . Atrial fibrillation Father   . Depression Sister        from chronic pain  . Arthritis Sister   . Heart disease Neg Hx   . Diabetes Neg Hx   . Cancer Neg Hx     Social History   Socioeconomic History  . Marital status: Married    Spouse name: Not on file  . Number of children: 1  . Years of education: Not on file  . Highest education level: Not on file  Occupational History  . 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 Financial resource strain: Not on file  . Food insecurity:    Worry: Not on file    Inability: Not on file  . Transportation needs:    Medical: Not on file    Non-medical: Not on file  Tobacco Use  . Smoking status: Never Smoker  . Smokeless tobacco: Former Systems developer    Types: Chew  . Tobacco comment: only occasionally  Substance and Sexual Activity  . Alcohol use: Yes    Alcohol/week: 2.0 standard drinks    Types: 2 Cans of beer per week  .  Drug use: No  . Sexual activity: Never  Lifestyle  . Physical activity:    Days per week: Not on file    Minutes per session: Not on file  . Stress: Not on file  Relationships  . Social connections:    Talks on phone: Not on file    Gets together: Not on file    Attends religious service: Not on file    Active member of club or organization: Not on file    Attends meetings of clubs or organizations: Not on file    Relationship status: Not on file  . Intimate partner violence:    Fear of current or ex partner: Not on file    Emotionally abused: Not on file    Physically abused: Not on file    Forced sexual activity: Not on file  Other Topics Concern  . Not on file  Social History Narrative   1 daughter---local   State graduate      Has living will   Wife is health care POA--daughter is alternate   Would accept  resuscitation   Not sure about feeding tube   Review of Systems Appetite is fine Weight stable Sleep is still not great---trouble with the shoulder Wears seat belt Teeth are okay---keeps up with dentist Bowels are fine--no blood No chest pain or SOB BP running low-- 364 systolic. Will get dizzy if he stoops over and stands up quick (maybe because he is not taking aleve lately) No edema Some heartburn--caused cough and AM phlegm. Uses the omeprazole daily prn (3 times a week or so). No dysphagia Did have a lot of tick bites---gave blood and recipient had reaction    Objective:   Physical Exam  Constitutional: He is oriented to person, place, and time. He appears well-developed. No distress.  HENT:  Mouth/Throat: Oropharynx is clear and moist. No oropharyngeal exudate.  Neck: No thyromegaly present.  Cardiovascular: Normal rate, regular rhythm, normal heart sounds and intact distal pulses. Exam reveals no gallop.  No murmur heard. Respiratory: Effort normal and breath sounds normal. No respiratory distress. He has no wheezes. He has no rales.  GI: Soft. There is no abdominal tenderness.  Musculoskeletal:        General: No tenderness or edema.  Lymphadenopathy:    He has no cervical adenopathy.  Neurological: He is alert and oriented to person, place, and time.  President--- "Daisy Floro, Obama, Bush" [phone] D-l-r-o-w Recall 3/3  Skin: No rash noted. No erythema.  Psychiatric: He has a normal mood and affect. His behavior is normal.           Assessment & Plan:

## 2018-10-18 NOTE — Assessment & Plan Note (Signed)
Mostly dealing with right shoulder now Not taking the aleve much now

## 2018-10-18 NOTE — Assessment & Plan Note (Signed)
Discussed options Lab Results  Component Value Date   PSA 5.62 (H) 10/12/2018   PSA 4.58 (H) 10/12/2017   No remarkable changes over some years Discussed options---will just plan to recheck next year

## 2018-10-18 NOTE — Assessment & Plan Note (Signed)
Fair control with the flomax bid

## 2018-10-18 NOTE — Assessment & Plan Note (Signed)
Doing well with PPI ~3 days per week

## 2018-10-18 NOTE — Progress Notes (Signed)
Hearing Screening   Method: Audiometry   125Hz  250Hz  500Hz  1000Hz  2000Hz  3000Hz  4000Hz  6000Hz  8000Hz   Right ear:   25 40 20  40    Left ear:   20 25 20  25     Vision Screening Comments: October 2019

## 2018-10-18 NOTE — Assessment & Plan Note (Signed)
I have personally reviewed the Medicare Annual Wellness questionnaire and have noted 1. The patient's medical and social history 2. Their use of alcohol, tobacco or illicit drugs 3. Their current medications and supplements 4. The patient's functional ability including ADL's, fall risks, home safety risks and hearing or visual             impairment. 5. Diet and physical activities 6. Evidence for depression or mood disorders  The patients weight, height, BMI and visual acuity have been recorded in the chart I have made referrals, counseling and provided education to the patient based review of the above and I have provided the pt with a written personalized care plan for preventive services.  I have provided you with a copy of your personalized plan for preventive services. Please take the time to review along with your updated medication list.  Yearly flu vaccine Colon due 2025 Discussed shingrix exercise

## 2018-10-25 DIAGNOSIS — R293 Abnormal posture: Secondary | ICD-10-CM | POA: Diagnosis not present

## 2018-10-25 DIAGNOSIS — M75121 Complete rotator cuff tear or rupture of right shoulder, not specified as traumatic: Secondary | ICD-10-CM | POA: Diagnosis not present

## 2018-10-25 DIAGNOSIS — M25511 Pain in right shoulder: Secondary | ICD-10-CM | POA: Diagnosis not present

## 2018-10-25 DIAGNOSIS — M6281 Muscle weakness (generalized): Secondary | ICD-10-CM | POA: Diagnosis not present

## 2018-10-27 DIAGNOSIS — M25511 Pain in right shoulder: Secondary | ICD-10-CM | POA: Diagnosis not present

## 2018-10-27 DIAGNOSIS — M6281 Muscle weakness (generalized): Secondary | ICD-10-CM | POA: Diagnosis not present

## 2018-10-27 DIAGNOSIS — M75121 Complete rotator cuff tear or rupture of right shoulder, not specified as traumatic: Secondary | ICD-10-CM | POA: Diagnosis not present

## 2018-10-27 DIAGNOSIS — R293 Abnormal posture: Secondary | ICD-10-CM | POA: Diagnosis not present

## 2018-11-01 DIAGNOSIS — R293 Abnormal posture: Secondary | ICD-10-CM | POA: Diagnosis not present

## 2018-11-01 DIAGNOSIS — M75121 Complete rotator cuff tear or rupture of right shoulder, not specified as traumatic: Secondary | ICD-10-CM | POA: Diagnosis not present

## 2018-11-01 DIAGNOSIS — M6281 Muscle weakness (generalized): Secondary | ICD-10-CM | POA: Diagnosis not present

## 2018-11-01 DIAGNOSIS — M25511 Pain in right shoulder: Secondary | ICD-10-CM | POA: Diagnosis not present

## 2018-11-08 DIAGNOSIS — M6281 Muscle weakness (generalized): Secondary | ICD-10-CM | POA: Diagnosis not present

## 2018-11-08 DIAGNOSIS — R293 Abnormal posture: Secondary | ICD-10-CM | POA: Diagnosis not present

## 2018-11-08 DIAGNOSIS — M25511 Pain in right shoulder: Secondary | ICD-10-CM | POA: Diagnosis not present

## 2018-11-08 DIAGNOSIS — M75121 Complete rotator cuff tear or rupture of right shoulder, not specified as traumatic: Secondary | ICD-10-CM | POA: Diagnosis not present

## 2018-11-11 ENCOUNTER — Other Ambulatory Visit: Payer: Self-pay | Admitting: Internal Medicine

## 2018-11-15 DIAGNOSIS — M75121 Complete rotator cuff tear or rupture of right shoulder, not specified as traumatic: Secondary | ICD-10-CM | POA: Diagnosis not present

## 2018-11-15 DIAGNOSIS — M6281 Muscle weakness (generalized): Secondary | ICD-10-CM | POA: Diagnosis not present

## 2018-11-15 DIAGNOSIS — R293 Abnormal posture: Secondary | ICD-10-CM | POA: Diagnosis not present

## 2018-11-15 DIAGNOSIS — M25511 Pain in right shoulder: Secondary | ICD-10-CM | POA: Diagnosis not present

## 2018-11-22 DIAGNOSIS — M75121 Complete rotator cuff tear or rupture of right shoulder, not specified as traumatic: Secondary | ICD-10-CM | POA: Diagnosis not present

## 2018-11-22 DIAGNOSIS — M25511 Pain in right shoulder: Secondary | ICD-10-CM | POA: Diagnosis not present

## 2018-11-22 DIAGNOSIS — M6281 Muscle weakness (generalized): Secondary | ICD-10-CM | POA: Diagnosis not present

## 2018-11-22 DIAGNOSIS — R293 Abnormal posture: Secondary | ICD-10-CM | POA: Diagnosis not present

## 2018-11-29 DIAGNOSIS — M75121 Complete rotator cuff tear or rupture of right shoulder, not specified as traumatic: Secondary | ICD-10-CM | POA: Diagnosis not present

## 2018-11-29 DIAGNOSIS — R293 Abnormal posture: Secondary | ICD-10-CM | POA: Diagnosis not present

## 2018-11-29 DIAGNOSIS — M25511 Pain in right shoulder: Secondary | ICD-10-CM | POA: Diagnosis not present

## 2018-11-29 DIAGNOSIS — M6281 Muscle weakness (generalized): Secondary | ICD-10-CM | POA: Diagnosis not present

## 2018-11-30 ENCOUNTER — Other Ambulatory Visit: Payer: Self-pay | Admitting: Internal Medicine

## 2018-12-01 DIAGNOSIS — M75112 Incomplete rotator cuff tear or rupture of left shoulder, not specified as traumatic: Secondary | ICD-10-CM | POA: Diagnosis not present

## 2018-12-01 DIAGNOSIS — M75121 Complete rotator cuff tear or rupture of right shoulder, not specified as traumatic: Secondary | ICD-10-CM | POA: Diagnosis not present

## 2018-12-01 DIAGNOSIS — M25512 Pain in left shoulder: Secondary | ICD-10-CM | POA: Diagnosis not present

## 2018-12-09 DIAGNOSIS — M6281 Muscle weakness (generalized): Secondary | ICD-10-CM | POA: Diagnosis not present

## 2018-12-09 DIAGNOSIS — M75121 Complete rotator cuff tear or rupture of right shoulder, not specified as traumatic: Secondary | ICD-10-CM | POA: Diagnosis not present

## 2018-12-09 DIAGNOSIS — R293 Abnormal posture: Secondary | ICD-10-CM | POA: Diagnosis not present

## 2018-12-09 DIAGNOSIS — M25511 Pain in right shoulder: Secondary | ICD-10-CM | POA: Diagnosis not present

## 2018-12-16 DIAGNOSIS — M75121 Complete rotator cuff tear or rupture of right shoulder, not specified as traumatic: Secondary | ICD-10-CM | POA: Diagnosis not present

## 2018-12-16 DIAGNOSIS — R293 Abnormal posture: Secondary | ICD-10-CM | POA: Diagnosis not present

## 2018-12-16 DIAGNOSIS — M6281 Muscle weakness (generalized): Secondary | ICD-10-CM | POA: Diagnosis not present

## 2018-12-16 DIAGNOSIS — M25511 Pain in right shoulder: Secondary | ICD-10-CM | POA: Diagnosis not present

## 2018-12-23 DIAGNOSIS — M75121 Complete rotator cuff tear or rupture of right shoulder, not specified as traumatic: Secondary | ICD-10-CM | POA: Diagnosis not present

## 2018-12-23 DIAGNOSIS — M6281 Muscle weakness (generalized): Secondary | ICD-10-CM | POA: Diagnosis not present

## 2018-12-23 DIAGNOSIS — R293 Abnormal posture: Secondary | ICD-10-CM | POA: Diagnosis not present

## 2018-12-23 DIAGNOSIS — M25511 Pain in right shoulder: Secondary | ICD-10-CM | POA: Diagnosis not present

## 2018-12-27 ENCOUNTER — Other Ambulatory Visit: Payer: Self-pay

## 2018-12-27 ENCOUNTER — Encounter: Payer: Self-pay | Admitting: Internal Medicine

## 2018-12-27 ENCOUNTER — Ambulatory Visit (INDEPENDENT_AMBULATORY_CARE_PROVIDER_SITE_OTHER): Payer: Medicare Other | Admitting: Internal Medicine

## 2018-12-27 DIAGNOSIS — R05 Cough: Secondary | ICD-10-CM | POA: Diagnosis not present

## 2018-12-27 DIAGNOSIS — R053 Chronic cough: Secondary | ICD-10-CM | POA: Insufficient documentation

## 2018-12-27 NOTE — Assessment & Plan Note (Signed)
Goes back over a year Mostly in AM but occasionally related to eating (in evening) No history of asthma and not clearly exercise related Omeprazole for extended time didn't seem to help---he just uses it prn for heartburn symptoms Likely from nasal drainage--actually seem to be better now with using the benedryl at night. Will continue this or switch to non sedating antihistamine like cetirizine if any side effects Will refer to Pulmonary if ongoing problems

## 2018-12-27 NOTE — Progress Notes (Signed)
Spoke to pt to verify his reason for the visit, allergies, and medications.  Virtual Visit via Telephone Note  I connected with Craig Jackson on 12/27/18 at  8:45 AM EDT by telephone and verified that I am speaking with the correct person using two identifiers.   I discussed the limitations, risks, security and privacy concerns of performing an evaluation and management service by telephone and the availability of in person appointments. I also discussed with the patient that there may be a patient responsible charge related to this service. The patient expressed understanding and agreed to proceed.   History of Present Illness: Patient in his home. I am in my office Seen last February and the cough has persisted that long. CXR negative then. Coughs up green/yellow mucous. Feels like he has to clear things out. May last 30 minutes or more Quiets if he sits for a while--may restart if he gets up  He is not sure what may be triggering it. Has tried to delay meds and supplements. May occur after just drinking water in the morning. Mostly seems to be when he gets moving around None in the past week Mostly in the morning--then may occur after eating in the evening (triggered after eating a meal. Had not been consistently taking the omeprazole---didn't really note it helping, even after 1-2 months Has tried mucinex--may have helped some  No chronic sinus or allergy problems No SOB No fever and has not been sick  No history of asthma Doesn't hear any wheezing   Was taking a sleep aide in the past Now using the benedryl at night----may be reason for recent improvement  Observations/Objective: Alert Normal speech No cough during the conversation  Assessment and Plan: Chronic cough---see problem list  Follow Up Instructions: See problem list   I discussed the assessment and treatment plan with the patient. The patient was provided an opportunity to ask questions and all were  answered. The patient agreed with the plan and demonstrated an understanding of the instructions.   The patient was advised to call back or seek an in-person evaluation if the symptoms worsen or if the condition fails to improve as anticipated.  I provided 14 minutes of non-face-to-face time during this encounter.   Viviana Simpler, MD

## 2018-12-30 DIAGNOSIS — M25511 Pain in right shoulder: Secondary | ICD-10-CM | POA: Diagnosis not present

## 2018-12-30 DIAGNOSIS — M6281 Muscle weakness (generalized): Secondary | ICD-10-CM | POA: Diagnosis not present

## 2018-12-30 DIAGNOSIS — R293 Abnormal posture: Secondary | ICD-10-CM | POA: Diagnosis not present

## 2019-07-27 ENCOUNTER — Ambulatory Visit (INDEPENDENT_AMBULATORY_CARE_PROVIDER_SITE_OTHER): Payer: Medicare Other

## 2019-07-27 DIAGNOSIS — Z23 Encounter for immunization: Secondary | ICD-10-CM

## 2019-10-11 ENCOUNTER — Other Ambulatory Visit: Payer: Self-pay | Admitting: Internal Medicine

## 2019-10-11 DIAGNOSIS — R972 Elevated prostate specific antigen [PSA]: Secondary | ICD-10-CM

## 2019-10-11 DIAGNOSIS — K219 Gastro-esophageal reflux disease without esophagitis: Secondary | ICD-10-CM

## 2019-10-11 DIAGNOSIS — M1000 Idiopathic gout, unspecified site: Secondary | ICD-10-CM

## 2019-10-12 ENCOUNTER — Telehealth: Payer: Self-pay

## 2019-10-12 NOTE — Telephone Encounter (Signed)
LVM to call clinic, pt needs COVID screen, front door and back lab info 1.7.2021 TLJ

## 2019-10-17 ENCOUNTER — Other Ambulatory Visit: Payer: Self-pay

## 2019-10-17 ENCOUNTER — Other Ambulatory Visit (INDEPENDENT_AMBULATORY_CARE_PROVIDER_SITE_OTHER): Payer: Medicare Other

## 2019-10-17 DIAGNOSIS — K219 Gastro-esophageal reflux disease without esophagitis: Secondary | ICD-10-CM | POA: Diagnosis not present

## 2019-10-17 DIAGNOSIS — M1000 Idiopathic gout, unspecified site: Secondary | ICD-10-CM | POA: Diagnosis not present

## 2019-10-17 DIAGNOSIS — R972 Elevated prostate specific antigen [PSA]: Secondary | ICD-10-CM | POA: Diagnosis not present

## 2019-10-17 LAB — COMPREHENSIVE METABOLIC PANEL
ALT: 11 U/L (ref 0–53)
AST: 10 U/L (ref 0–37)
Albumin: 4.3 g/dL (ref 3.5–5.2)
Alkaline Phosphatase: 74 U/L (ref 39–117)
BUN: 23 mg/dL (ref 6–23)
CO2: 27 mEq/L (ref 19–32)
Calcium: 9.5 mg/dL (ref 8.4–10.5)
Chloride: 105 mEq/L (ref 96–112)
Creatinine, Ser: 1.01 mg/dL (ref 0.40–1.50)
GFR: 73.02 mL/min (ref >60.00)
Glucose, Bld: 98 mg/dL (ref 70–99)
Potassium: 4.9 mEq/L (ref 3.5–5.1)
Sodium: 139 mEq/L (ref 135–145)
Total Bilirubin: 0.7 mg/dL (ref 0.2–1.2)
Total Protein: 7.1 g/dL (ref 6.0–8.3)

## 2019-10-17 LAB — CBC
HCT: 36.8 % — ABNORMAL LOW (ref 39.0–52.0)
Hemoglobin: 11.6 g/dL — ABNORMAL LOW (ref 13.0–17.0)
MCHC: 31.5 g/dL (ref 30.0–36.0)
MCV: 82.1 fl (ref 78.0–100.0)
Platelets: 284 10*3/uL (ref 150.0–400.0)
RBC: 4.48 Mil/uL (ref 4.22–5.81)
RDW: 17.3 % — ABNORMAL HIGH (ref 11.5–15.5)
WBC: 6.3 10*3/uL (ref 4.0–10.5)

## 2019-10-17 LAB — URIC ACID: Uric Acid, Serum: 6.5 mg/dL (ref 4.0–7.8)

## 2019-10-18 LAB — PSA, TOTAL AND FREE
PSA, % Free: 20 % (calc) — ABNORMAL LOW (ref >25)
PSA, Free: 1.4 ng/mL
PSA, Total: 6.9 ng/mL — ABNORMAL HIGH (ref <=4.0)

## 2019-10-19 ENCOUNTER — Encounter: Payer: Self-pay | Admitting: *Deleted

## 2019-10-19 IMAGING — DX DG CHEST 2V
2 series · 2 of 2 positions shown · non-contrast
Comparison: None.

CLINICAL DATA: Chronic cough.

EXAM:
CHEST  2 VIEW

[chest pa]
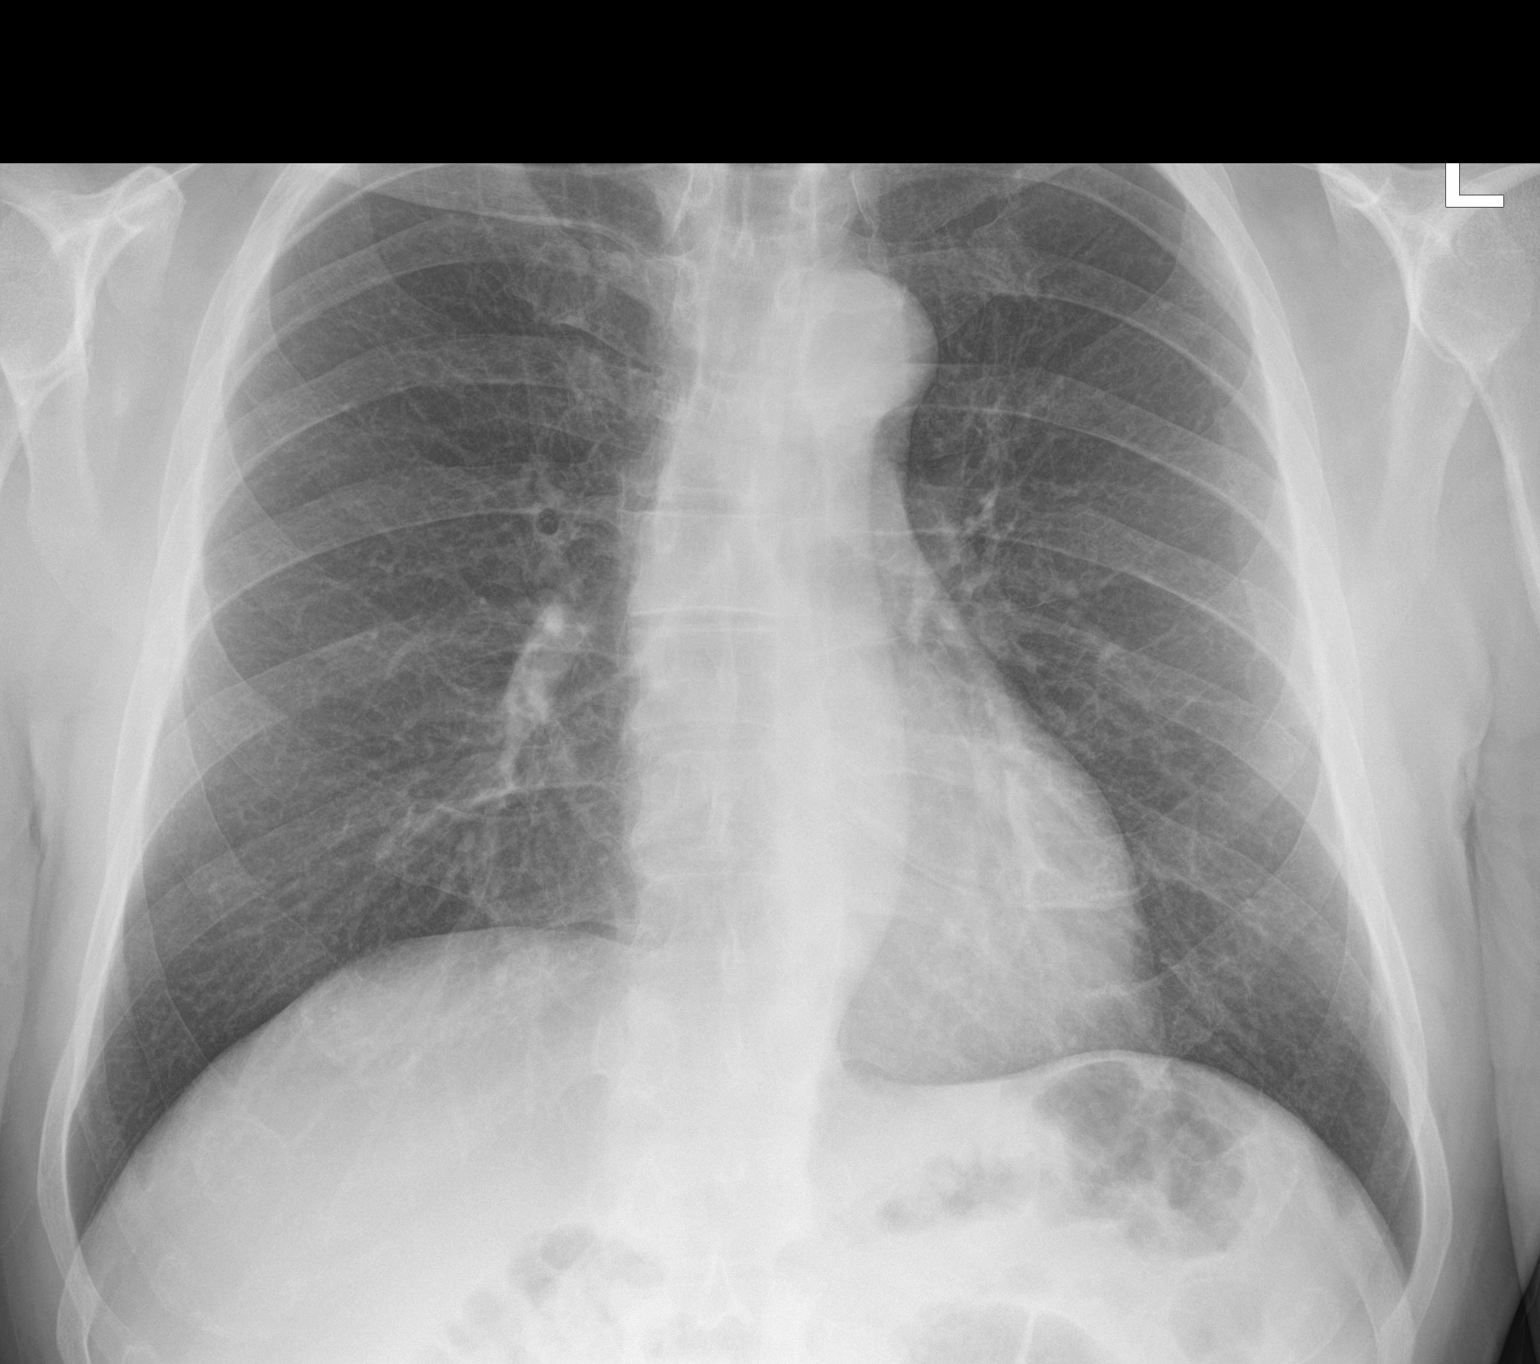

[chest lat]
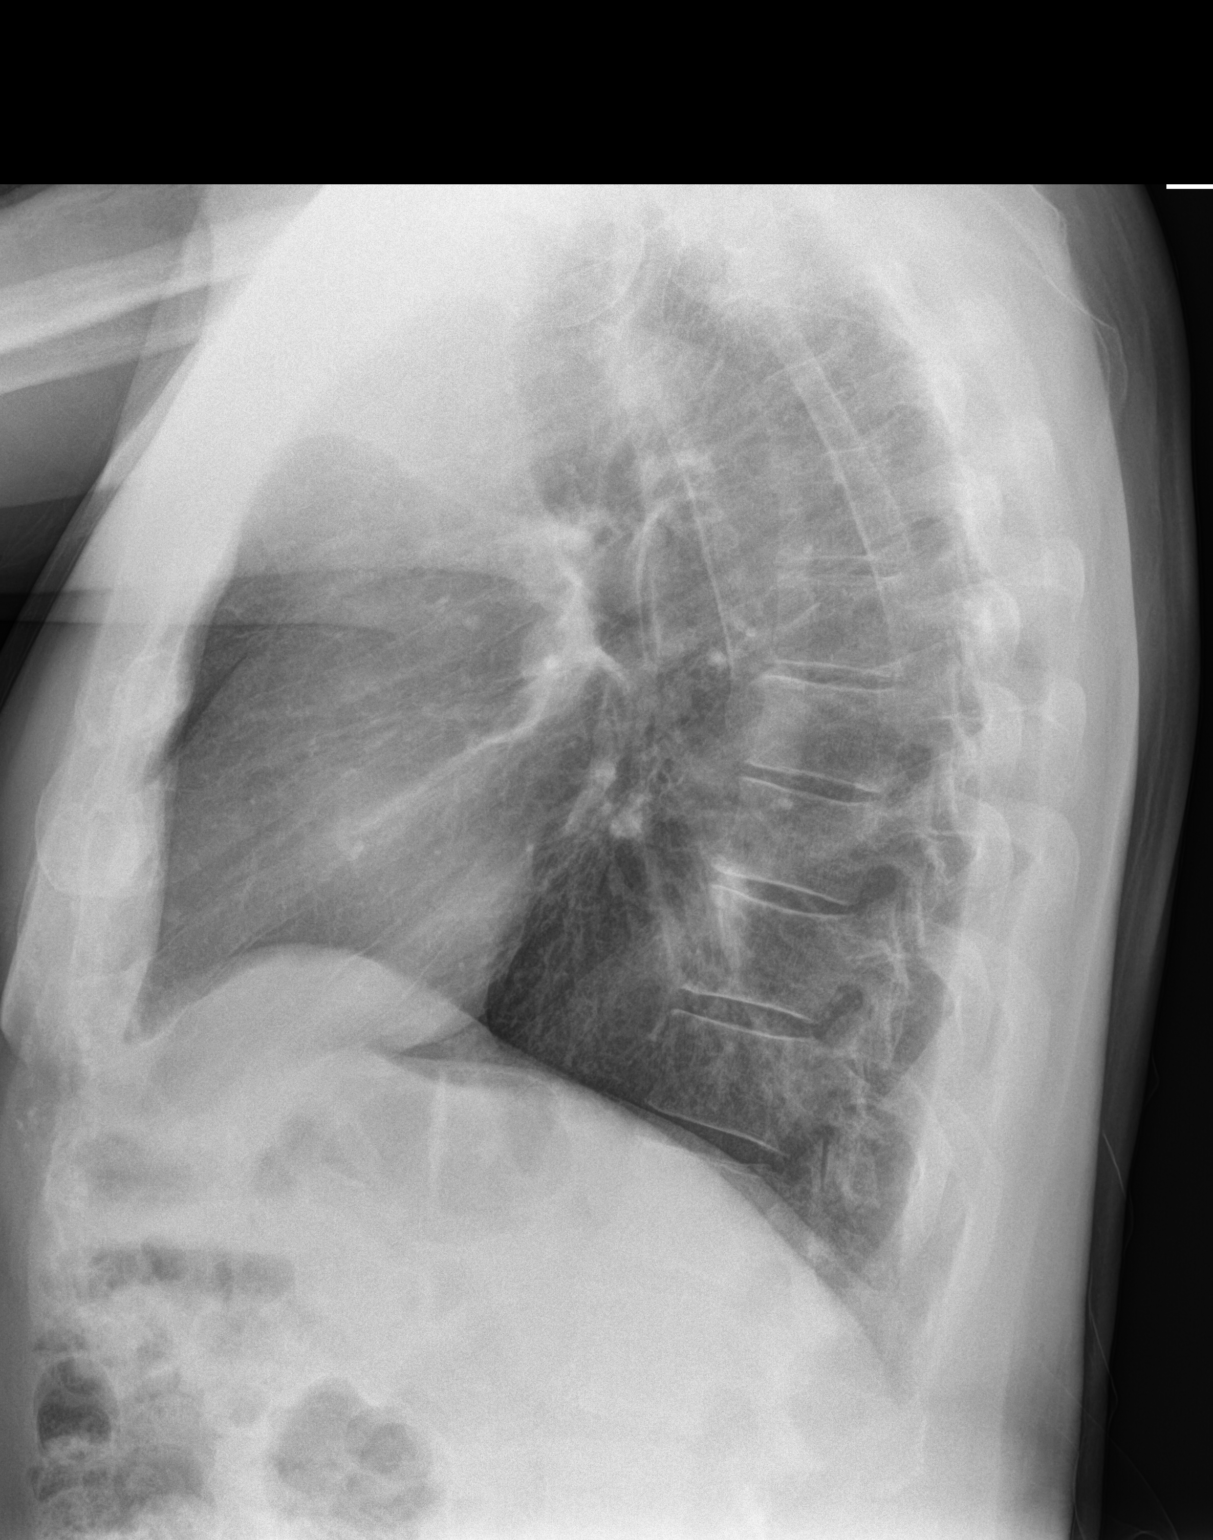

[2 of 2 positions shown; findings below may reference images not displayed]

FINDINGS: Lungs are clear. Heart size is normal. No pneumothorax or pleural
effusion. No bony abnormality.
IMPRESSION: Negative chest.

## 2019-10-24 ENCOUNTER — Ambulatory Visit (INDEPENDENT_AMBULATORY_CARE_PROVIDER_SITE_OTHER): Payer: Medicare Other | Admitting: Internal Medicine

## 2019-10-24 ENCOUNTER — Other Ambulatory Visit: Payer: Self-pay

## 2019-10-24 ENCOUNTER — Encounter: Payer: Self-pay | Admitting: Internal Medicine

## 2019-10-24 VITALS — BP 120/68 | HR 68 | Temp 97.9°F | Ht 71.5 in | Wt 182.0 lb

## 2019-10-24 DIAGNOSIS — K219 Gastro-esophageal reflux disease without esophagitis: Secondary | ICD-10-CM

## 2019-10-24 DIAGNOSIS — M159 Polyosteoarthritis, unspecified: Secondary | ICD-10-CM

## 2019-10-24 DIAGNOSIS — Z7189 Other specified counseling: Secondary | ICD-10-CM | POA: Diagnosis not present

## 2019-10-24 DIAGNOSIS — R972 Elevated prostate specific antigen [PSA]: Secondary | ICD-10-CM | POA: Diagnosis not present

## 2019-10-24 DIAGNOSIS — Z0000 Encounter for general adult medical examination without abnormal findings: Secondary | ICD-10-CM | POA: Diagnosis not present

## 2019-10-24 DIAGNOSIS — N401 Enlarged prostate with lower urinary tract symptoms: Secondary | ICD-10-CM

## 2019-10-24 DIAGNOSIS — N138 Other obstructive and reflux uropathy: Secondary | ICD-10-CM

## 2019-10-24 NOTE — Progress Notes (Signed)
Hearing Screening  ° 125Hz 250Hz 500Hz 1000Hz 2000Hz 3000Hz 4000Hz 6000Hz 8000Hz  °Right ear:   20 20 20  20    °Left ear:   20 20 20  20    ° ° Visual Acuity Screening  ° Right eye Left eye Both eyes  °Without correction:     °With correction: 20/20 20/20 20/15  ° ° °

## 2019-10-24 NOTE — Assessment & Plan Note (Signed)
Still with limited motion in right shoulder--failed rotator cuff tear repair He continues to do exercises for strength Hands are steady--uses chondroitin for this and he feels it helps

## 2019-10-24 NOTE — Assessment & Plan Note (Signed)
Continues to use tamsulosin for this Daily sildenafil 20mg  also helps He will look into the tadalafil price

## 2019-10-24 NOTE — Progress Notes (Signed)
Subjective:    Patient ID: Craig Jackson, male    DOB: [birth year], 69 y.o.   MRN: [id number]  HPI Here for Medicare wellness visit and follow up of chronic health conditions  This visit occurred during the SARS-CoV-2 public health emergency.  Safety protocols were in place, including screening questions prior to the visit, additional usage of staff PPE, and extensive cleaning of exam room while observing appropriate contact time as indicated for disinfecting solutions.   Reviewed form and advanced directives Reviewed other doctors Vision and hearing are fine Enjoys 1-2 beers most days No tobacco Exercises regularly No falls No depression or anhedonia Independent with instrumental ADLs  Just has typical aches and pains Reviewed labs--- hemoglobin low but he had just given double RBC donation  Reviewed his PSA Has been over 5 for as long as 20 years (and up to 10 with prostatitis) Did have 2 biopsies--both negative. (2002 and 2006 he thinks) Up some this year to 6.9 Reviewed options  Still on tamsulosin Uses the sildenafil for this also  Acid symptoms are better Stopped taking daily orange juice Now only rare heartburn--uses the PPI only prn and not often No dysphagia  Current Outpatient Medications on File Prior to Visit  Medication Sig Dispense Refill  . CHONDROITIN SULFATE A PO Take 1,600 mg by mouth daily.    . diphenhydrAMINE (BENADRYL) 25 MG tablet Take 50 mg by mouth at bedtime.    . Fish Oil-Cholecalciferol (FISH OIL + D3 PO) Take by mouth.    Marland Kitchen omeprazole (PRILOSEC) 20 MG capsule TAKE 1 CAPSULE BY MOUTH EVERY DAY 90 capsule 3  . OVER THE COUNTER MEDICATION Super Prostate 2 tablets daily    . sildenafil (REVATIO) 20 MG tablet TAKE 3-5 TABLETS BY MOUTH AS NEEDED PRIOR TO INTERCOURSE 50 tablet 11  . tamsulosin (FLOMAX) 0.4 MG CAPS capsule Take 1 capsule (0.4 mg total) by mouth 2 (two) times daily. 180 capsule 3   No current facility-administered medications on  file prior to visit.    No Known Allergies  Past Medical History:  Diagnosis Date  . Arthritis   . BPH with obstruction/lower urinary tract symptoms   . Elevated PSA   . 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 GERD (gastroesophageal reflux disease)   . Gout     Past Surgical History:  Procedure Laterality Date  . APPENDECTOMY    . CATARACT EXTRACTION    . EYE SURGERY  2016   detached retina  . ROTATOR CUFF REPAIR    . SHOULDER SURGERY Left 1972   separation    Family History  Problem Relation Age of Onset  . Arthritis Mother   . Atrial fibrillation Father   . Depression Sister        from chronic pain  . Arthritis Sister   . Heart disease Neg Hx   . Diabetes Neg Hx   . Cancer Neg Hx     Social History   Socioeconomic History  . Marital status: Married    Spouse name: Not on file  . Number of children: 1  . Years of education: Not on file  . Highest education level: Not on file  Occupational History  . Occupation: Materials engineer    Comment: retired  Tobacco Use  . Smoking status: Never Smoker  . Smokeless tobacco: Former Systems developer    Types: Chew  . Tobacco comment: only occasionally  Substance and Sexual Activity  . Alcohol use: Yes  Alcohol/week: 2.0 standard drinks    Types: 2 Cans of beer per week  . Drug use: No  . Sexual activity: Never  Other Topics Concern  . Not on file  Social History Narrative   1 daughter---local   State graduate      Has living will   Wife is health care POA--daughter is alternate   Would accept resuscitation   Not sure about feeding tube   Social Determinants of Health   Financial Resource Strain:   . Difficulty of Paying Living Expenses: Not on file  Food Insecurity:   . Worried About Charity fundraiser in the Last Year: Not on file  . Ran Out of Food in the Last Year: Not on file  Transportation Needs:   . Lack of Transportation (Medical): Not on file  . Lack of Transportation  (Non-Medical): Not on file  Physical Activity:   . Days of Exercise per Week: Not on file  . Minutes of Exercise per Session: Not on file  Stress:   . Feeling of Stress : Not on file  Social Connections:   . Frequency of Communication with Friends and Family: Not on file  . Frequency of Social Gatherings with Friends and Family: Not on file  . Attends Religious Services: Not on file  . Active Member of Clubs or Organizations: Not on file  . Attends Archivist Meetings: Not on file  . Marital Status: Not on file  Intimate Partner Violence:   . Fear of Current or Ex-Partner: Not on file  . Emotionally Abused: Not on file  . Physically Abused: Not on file  . Sexually Abused: Not on file   Review of Systems Appetite is fine Weight is stable Wears seat belt Teeth fine--keeps up with dentist Did see derm PA---no concerns there No chest pain or SOB No change in exercise tolerance No dizziness or syncope Bowels are fine--no blood     Objective:   Physical Exam  Constitutional: He is oriented to person, place, and time. He appears well-developed. No distress.  HENT:  Mouth/Throat: Oropharynx is clear and moist. No oropharyngeal exudate.  Neck: No thyromegaly present.  Cardiovascular: Normal rate, regular rhythm, normal heart sounds and intact distal pulses. Exam reveals no gallop.  No murmur heard. Respiratory: Effort normal and breath sounds normal. No respiratory distress. He has no wheezes. He has no rales.  GI: Soft. There is no abdominal tenderness.  Musculoskeletal:        General: No tenderness or edema.  Lymphadenopathy:    He has no cervical adenopathy.  Neurological: He is alert and oriented to person, place, and time.  President--- "Daisy Floro, Abbe [place]" [id number] D-l-r-o-w Recall 3/3  Skin: No rash noted. No erythema.  Psychiatric: He has a normal mood and affect. His behavior is normal.           Assessment & Plan:

## 2019-10-24 NOTE — Patient Instructions (Signed)
Please check the price on tadalafil 5mg  daily. If it is acceptable, let me know and I can give a prescription.

## 2019-10-24 NOTE — Assessment & Plan Note (Signed)
See social history 

## 2019-10-24 NOTE — Assessment & Plan Note (Signed)
Long standing elevations and 2 negative biopsies long ago Not as worrisome given this Discussed options Will recheck in 6 months

## 2019-10-24 NOTE — Assessment & Plan Note (Signed)
I have personally reviewed the Medicare Annual Wellness questionnaire and have noted 1. The patient's medical and social history 2. Their use of alcohol, tobacco or illicit drugs 3. Their current medications and supplements 4. The patient's functional ability including ADL's, fall risks, home safety risks and hearing or visual             impairment. 5. Diet and physical activities 6. Evidence for depression or mood disorders  The patients weight, height, BMI and visual acuity have been recorded in the chart I have made referrals, counseling and provided education to the patient based review of the above and I have provided the pt with a written personalized care plan for preventive services.  I have provided you with a copy of your personalized plan for preventive services. Please take the time to review along with your updated medication list.  Healthy--exercises regularly Yearly flu vaccine Discussed shingrix at pharmacy Will get COVID vaccine when he gets appt Colon due 2025 (recent anemia is due to blood donation shortly before the blood draw)

## 2019-10-24 NOTE — Assessment & Plan Note (Signed)
Better since cutting out orange juice Has the PPI for prn use

## 2019-10-25 MED ORDER — TADALAFIL 5 MG PO TABS: 5.0000 mg | ORAL_TABLET | Freq: Every day | ORAL | 3 refills | Status: DC

## 2019-10-31 ENCOUNTER — Telehealth: Payer: Self-pay

## 2019-10-31 NOTE — Telephone Encounter (Signed)
PA for Tadalafil denied because this medication is not on formulary drug list. Patient has not tried 2 covered drugs (Has tried Tamsulosin) but needs to try one more at least: Other covered drug(s) is/are: alfuzosin hcl ER oral tablet (10 mg), dutasteride oral capsule (0.5 mg), finasteride oral tablet (5 mg).

## 2019-11-01 NOTE — Telephone Encounter (Signed)
Yes, I got a fax about that. I didn't think it was covered---he will need to check cash prices----often inexpensive now with GoodRx card

## 2019-11-01 NOTE — Telephone Encounter (Signed)
I called pt and informed him of Dr. Alla German message. Pt states he already was aware that his insurance did not cover it and already has paid cash. Nothing further is needed.

## 2019-11-10 DIAGNOSIS — Z23 Encounter for immunization: Secondary | ICD-10-CM | POA: Diagnosis not present

## 2019-12-01 DIAGNOSIS — Z23 Encounter for immunization: Secondary | ICD-10-CM | POA: Diagnosis not present

## 2019-12-24 ENCOUNTER — Other Ambulatory Visit: Payer: Self-pay | Admitting: Internal Medicine

## 2020-02-13 ENCOUNTER — Other Ambulatory Visit: Payer: Self-pay | Admitting: Internal Medicine

## 2020-04-09 ENCOUNTER — Other Ambulatory Visit: Payer: Self-pay

## 2020-04-09 ENCOUNTER — Other Ambulatory Visit (INDEPENDENT_AMBULATORY_CARE_PROVIDER_SITE_OTHER): Payer: Medicare Other

## 2020-04-09 DIAGNOSIS — R972 Elevated prostate specific antigen [PSA]: Secondary | ICD-10-CM

## 2020-04-09 LAB — PSA: PSA: 4.74 ng/mL — ABNORMAL HIGH (ref 0.10–4.00)

## 2020-06-25 DIAGNOSIS — Z23 Encounter for immunization: Secondary | ICD-10-CM | POA: Diagnosis not present

## 2020-08-06 DIAGNOSIS — M25512 Pain in left shoulder: Secondary | ICD-10-CM | POA: Diagnosis not present

## 2020-08-06 DIAGNOSIS — M75112 Incomplete rotator cuff tear or rupture of left shoulder, not specified as traumatic: Secondary | ICD-10-CM | POA: Diagnosis not present

## 2020-10-08 ENCOUNTER — Other Ambulatory Visit: Payer: Self-pay | Admitting: Internal Medicine

## 2020-10-08 DIAGNOSIS — K219 Gastro-esophageal reflux disease without esophagitis: Secondary | ICD-10-CM

## 2020-10-08 DIAGNOSIS — R972 Elevated prostate specific antigen [PSA]: Secondary | ICD-10-CM

## 2020-10-23 ENCOUNTER — Other Ambulatory Visit: Payer: Self-pay

## 2020-10-23 ENCOUNTER — Other Ambulatory Visit (INDEPENDENT_AMBULATORY_CARE_PROVIDER_SITE_OTHER): Payer: Medicare Other

## 2020-10-23 ENCOUNTER — Other Ambulatory Visit: Payer: Medicare Other

## 2020-10-23 DIAGNOSIS — K219 Gastro-esophageal reflux disease without esophagitis: Secondary | ICD-10-CM

## 2020-10-23 DIAGNOSIS — R972 Elevated prostate specific antigen [PSA]: Secondary | ICD-10-CM | POA: Diagnosis not present

## 2020-10-24 LAB — COMPREHENSIVE METABOLIC PANEL
ALT: 12 U/L (ref 0–53)
AST: 11 U/L (ref 0–37)
Albumin: 4.6 g/dL (ref 3.5–5.2)
Alkaline Phosphatase: 69 U/L (ref 39–117)
BUN: 13 mg/dL (ref 6–23)
CO2: 28 mEq/L (ref 19–32)
Calcium: 9.8 mg/dL (ref 8.4–10.5)
Chloride: 103 mEq/L (ref 96–112)
Creatinine, Ser: 1.03 mg/dL (ref 0.40–1.50)
GFR: 73.31 mL/min (ref >60.00)
Glucose, Bld: 92 mg/dL (ref 70–99)
Potassium: 4.5 mEq/L (ref 3.5–5.1)
Sodium: 138 mEq/L (ref 135–145)
Total Bilirubin: 1.3 mg/dL — ABNORMAL HIGH (ref 0.2–1.2)
Total Protein: 6.9 g/dL (ref 6.0–8.3)

## 2020-10-24 LAB — CBC
HCT: 38.4 % — ABNORMAL LOW (ref 39.0–52.0)
Hemoglobin: 12.2 g/dL — ABNORMAL LOW (ref 13.0–17.0)
MCHC: 31.8 g/dL (ref 30.0–36.0)
MCV: 83.9 fl (ref 78.0–100.0)
Platelets: 300 10*3/uL (ref 150.0–400.0)
RBC: 4.57 Mil/uL (ref 4.22–5.81)
RDW: 18.8 % — ABNORMAL HIGH (ref 11.5–15.5)
WBC: 7 10*3/uL (ref 4.0–10.5)

## 2020-10-24 LAB — PSA: PSA: 7.06 ng/mL — ABNORMAL HIGH (ref 0.10–4.00)

## 2020-10-25 ENCOUNTER — Encounter: Payer: Self-pay | Admitting: Internal Medicine

## 2020-10-25 ENCOUNTER — Ambulatory Visit (INDEPENDENT_AMBULATORY_CARE_PROVIDER_SITE_OTHER): Payer: Medicare Other | Admitting: Internal Medicine

## 2020-10-25 ENCOUNTER — Other Ambulatory Visit: Payer: Self-pay

## 2020-10-25 VITALS — BP 106/70 | HR 57 | Temp 96.8°F | Ht 71.0 in | Wt 180.0 lb

## 2020-10-25 DIAGNOSIS — N401 Enlarged prostate with lower urinary tract symptoms: Secondary | ICD-10-CM

## 2020-10-25 DIAGNOSIS — Z0000 Encounter for general adult medical examination without abnormal findings: Secondary | ICD-10-CM

## 2020-10-25 DIAGNOSIS — Z7189 Other specified counseling: Secondary | ICD-10-CM | POA: Diagnosis not present

## 2020-10-25 DIAGNOSIS — N138 Other obstructive and reflux uropathy: Secondary | ICD-10-CM | POA: Diagnosis not present

## 2020-10-25 DIAGNOSIS — M159 Polyosteoarthritis, unspecified: Secondary | ICD-10-CM | POA: Diagnosis not present

## 2020-10-25 DIAGNOSIS — R972 Elevated prostate specific antigen [PSA]: Secondary | ICD-10-CM

## 2020-10-25 DIAGNOSIS — K219 Gastro-esophageal reflux disease without esophagitis: Secondary | ICD-10-CM

## 2020-10-25 DIAGNOSIS — Z23 Encounter for immunization: Secondary | ICD-10-CM | POA: Diagnosis not present

## 2020-10-25 MED ORDER — TAMSULOSIN HCL 0.4 MG PO CAPS: 0.4000 mg | ORAL_CAPSULE | Freq: Every day | ORAL | 0 refills | Status: DC

## 2020-10-25 MED ORDER — OMEPRAZOLE 20 MG PO CPDR: 20.0000 mg | DELAYED_RELEASE_CAPSULE | Freq: Every day | ORAL | 0 refills | Status: DC | PRN

## 2020-10-25 NOTE — Assessment & Plan Note (Signed)
See social history 

## 2020-10-25 NOTE — Progress Notes (Signed)
Subjective:    Patient ID: Craig Jackson, male    DOB: 07-02-1951, 70 y.o.   MRN: [id number]  HPI Here for Medicare wellness visit and follow up of chronic health conditions This visit occurred during the SARS-CoV-2 public health emergency.  Safety protocols were in place, including screening questions prior to the visit, additional usage of staff PPE, and extensive cleaning of exam room while observing appropriate contact time as indicated for disinfecting solutions.   Reviewed form and advanced directives Reviewed other doctors Still enjoys 1-2 beers daily No tobacco Exercises regularly and stays active Vision and hearing are fine No falls No depression or anhedonia Independent with instrumental ADLs No memory issues  Having some muscle aching at times---in legs Notes it at night--?after a busy day Tried off the cialis---daily---this might have contributed to the muscle aching  cialis does help his urine flow Continues on tamsulosin--just one a day now (flow is better on it) PSA is up again-- 7 is the highest Has had 2 negative prostate biopsies already  No gout issues No recent problems with arthritis--other than fingers  Only using the omeprazole prn Only sporadic heartburn No dysphagia  Current Outpatient Medications on File Prior to Visit  Medication Sig Dispense Refill   CHONDROITIN SULFATE A PO Take 1,600 mg by mouth daily.     diphenhydrAMINE (BENADRYL) 25 MG tablet Take 25 mg by mouth at bedtime.     Fish Oil-Cholecalciferol (FISH OIL + D3 PO) Take by mouth.     OVER THE COUNTER MEDICATION Super Prostate 2 tablets daily     tadalafil (CIALIS) 5 MG tablet Take 1 tablet (5 mg total) by mouth daily. 90 tablet 3   No current facility-administered medications on file prior to visit.    No Known Allergies  Past Medical History:  Diagnosis Date   Arthritis    BPH with obstruction/lower urinary tract symptoms    Elevated PSA    Generalized  osteoarthritis of multiple sites    mostly hands   GERD (gastroesophageal reflux disease)    Gout     Past Surgical History:  Procedure Laterality Date   APPENDECTOMY     CATARACT EXTRACTION     EYE SURGERY  2016   detached retina   ROTATOR CUFF REPAIR     SHOULDER SURGERY Left 1972   separation    Family History  Problem Relation Age of Onset   Arthritis Mother    Atrial fibrillation Father    Depression Sister        from chronic pain   Arthritis Sister    Heart disease Neg Hx    Diabetes Neg Hx    Cancer Neg Hx     Social History   Socioeconomic History   Marital status: Married    Spouse name: Not on file   Number of children: 1   Years of education: Not on file   Highest education level: Not on file  Occupational History   Occupation: Materials engineer    Comment: retired  Tobacco Use   Smoking status: Never Smoker   Smokeless tobacco: Former Systems developer    Types: Chew   Tobacco comment: only occasionally  Substance and Sexual Activity   Alcohol use: Yes    Alcohol/week: 2.0 standard drinks    Types: 2 Cans of beer per week   Drug use: No   Sexual activity: Never  Other Topics Concern   Not on file  Social History Narrative   1 daughter---local  State graduate      Has living will   Wife is health care POA--daughter is alternate   Would accept resuscitation   Not sure about feeding tube   Social Determinants of Health   Financial Resource Strain: Not on file  Food Insecurity: Not on file  Transportation Needs: Not on file  Physical Activity: Not on file  Stress: Not on file  Social Connections: Not on file  Intimate Partner Violence: Not on file   Review of Systems Appetite is good Weight is stable Sleeps okay---but uses the benadryl nightly. Not happy with melatonin. Discussed trying to use it prn only Wears seat belt Bowels are fine Did donate blood not long ago--did a double. Hemoglobin 15.4 before the  donation No chest pain or SOB No dizziness or syncope No palpitations No suspicious skin lesions    Objective:   Physical Exam Constitutional:      Appearance: Normal appearance.  HENT:     Mouth/Throat:     Comments: No lesions Eyes:     Conjunctiva/sclera: Conjunctivae normal.     Pupils: Pupils are equal, round, and reactive to light.  Cardiovascular:     Rate and Rhythm: Normal rate and regular rhythm.     Pulses: Normal pulses.     Heart sounds: No murmur heard. No gallop.   Pulmonary:     Effort: Pulmonary effort is normal.     Breath sounds: Normal breath sounds. No wheezing or rales.  Abdominal:     Palpations: Abdomen is soft.     Tenderness: There is no abdominal tenderness.  Musculoskeletal:     Cervical back: Neck supple.     Right lower leg: No edema.     Left lower leg: No edema.  Lymphadenopathy:     Cervical: No cervical adenopathy.  Skin:    General: Skin is warm.     Findings: No rash.  Neurological:     Mental Status: He is alert and oriented to person, place, and time.     Comments: President-- "Edmon Crape, Trump, Obama" [id number] D-l-r-o-w Recall 3/3  Psychiatric:        Mood and Affect: Mood normal.        Behavior: Behavior normal.            Assessment & Plan:

## 2020-10-25 NOTE — Assessment & Plan Note (Signed)
Mostly in hands Uses the chondroitin Some muscle aching

## 2020-10-25 NOTE — Assessment & Plan Note (Signed)
I have personally reviewed the Medicare Annual Wellness questionnaire and have noted 1. The patient's medical and social history 2. Their use of alcohol, tobacco or illicit drugs 3. Their current medications and supplements 4. The patient's functional ability including ADL's, fall risks, home safety risks and hearing or visual             impairment. 5. Diet and physical activities 6. Evidence for depression or mood disorders  The patients weight, height, BMI and visual acuity have been recorded in the chart I have made referrals, counseling and provided education to the patient based review of the above and I have provided the pt with a written personalized care plan for preventive services.  I have provided you with a copy of your personalized plan for preventive services. Please take the time to review along with your updated medication list.  Colon due 2025 Stays fit Flu vaccine today Td at the pharmacy Consider shingrix also

## 2020-10-25 NOTE — Assessment & Plan Note (Signed)
Better Only uses the omeprazole prn

## 2020-10-25 NOTE — Assessment & Plan Note (Signed)
Up to 7 now 2 negative biopsies though Will recheck in 6-12 months

## 2020-10-25 NOTE — Progress Notes (Signed)
Hearing Screening   Method: Audiometry   125Hz 250Hz 500Hz 1000Hz 2000Hz 3000Hz 4000Hz 6000Hz 8000Hz  Right ear:   20 20 20  20    Left ear:   20 20 20  20    Vision Screening Comments: August 2021   

## 2020-10-25 NOTE — Assessment & Plan Note (Signed)
Does okay with the tadalafill and tamsulosin

## 2020-11-14 ENCOUNTER — Other Ambulatory Visit: Payer: Self-pay | Admitting: Internal Medicine

## 2020-12-03 DIAGNOSIS — M75112 Incomplete rotator cuff tear or rupture of left shoulder, not specified as traumatic: Secondary | ICD-10-CM | POA: Diagnosis not present

## 2020-12-03 DIAGNOSIS — M25512 Pain in left shoulder: Secondary | ICD-10-CM | POA: Diagnosis not present

## 2020-12-11 DIAGNOSIS — S46812A Strain of other muscles, fascia and tendons at shoulder and upper arm level, left arm, initial encounter: Secondary | ICD-10-CM | POA: Diagnosis not present

## 2020-12-11 DIAGNOSIS — M25512 Pain in left shoulder: Secondary | ICD-10-CM | POA: Diagnosis not present

## 2020-12-11 DIAGNOSIS — M7582 Other shoulder lesions, left shoulder: Secondary | ICD-10-CM | POA: Diagnosis not present

## 2020-12-11 DIAGNOSIS — S46212A Strain of muscle, fascia and tendon of other parts of biceps, left arm, initial encounter: Secondary | ICD-10-CM | POA: Diagnosis not present

## 2020-12-11 DIAGNOSIS — M75102 Unspecified rotator cuff tear or rupture of left shoulder, not specified as traumatic: Secondary | ICD-10-CM | POA: Diagnosis not present

## 2020-12-17 DIAGNOSIS — M75122 Complete rotator cuff tear or rupture of left shoulder, not specified as traumatic: Secondary | ICD-10-CM | POA: Diagnosis not present

## 2020-12-17 DIAGNOSIS — M65812 Other synovitis and tenosynovitis, left shoulder: Secondary | ICD-10-CM | POA: Diagnosis not present

## 2020-12-17 DIAGNOSIS — M25512 Pain in left shoulder: Secondary | ICD-10-CM | POA: Diagnosis not present

## 2020-12-18 DIAGNOSIS — R293 Abnormal posture: Secondary | ICD-10-CM | POA: Diagnosis not present

## 2020-12-18 DIAGNOSIS — M75112 Incomplete rotator cuff tear or rupture of left shoulder, not specified as traumatic: Secondary | ICD-10-CM | POA: Diagnosis not present

## 2020-12-18 DIAGNOSIS — M25512 Pain in left shoulder: Secondary | ICD-10-CM | POA: Diagnosis not present

## 2020-12-19 ENCOUNTER — Telehealth (INDEPENDENT_AMBULATORY_CARE_PROVIDER_SITE_OTHER): Payer: Medicare Other | Admitting: Family Medicine

## 2020-12-19 DIAGNOSIS — R059 Cough, unspecified: Secondary | ICD-10-CM

## 2020-12-19 DIAGNOSIS — R0981 Nasal congestion: Secondary | ICD-10-CM

## 2020-12-19 DIAGNOSIS — R52 Pain, unspecified: Secondary | ICD-10-CM | POA: Diagnosis not present

## 2020-12-19 NOTE — Progress Notes (Signed)
Virtual Visit via Video Note  I connected with Craig Jackson   on 12/19/20 at 12:20 PM EDT by a video enabled telemedicine application and verified that I am speaking with the correct person using two identifiers.  Location patient: home, [place] Location provider:work or home office Persons participating in the virtual visit: patient, provider  I discussed the limitations of evaluation and management by telemedicine and the availability of in person appointments. The patient expressed understanding and agreed to proceed.   HPI:  Acute telemedicine visit for sinus issues: -Onset: 5 days ago -wife was sick, then he got sick -Symptoms include: nasal congestion, sinus discomfort, saw a few streaks of blood in the mucus when blew his now on occassion, diarrhea initially, body aches -Denies:fevers, CP, SOB, NVD, inability to eat/drink/get out of bed -Has tried: musinex -Pertinent past medical history: see below -Pertinent medication allergies: nkda -COVID-19 vaccine status: has had 2 doses plus booster; had flu shot  ROS: See pertinent positives and negatives per HPI.  Past Medical History:  Diagnosis Date  . Arthritis   . BPH with obstruction/lower urinary tract symptoms   . Elevated PSA   . 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 GERD (gastroesophageal reflux disease)   . Gout     Past Surgical History:  Procedure Laterality Date  . APPENDECTOMY    . CATARACT EXTRACTION    . EYE SURGERY  2016   detached retina  . ROTATOR CUFF REPAIR    . SHOULDER SURGERY Left 1972   separation     Current Outpatient Medications:  .  CHONDROITIN SULFATE A PO, Take 1,600 mg by mouth daily., Disp: , Rfl:  .  diphenhydrAMINE (BENADRYL) 25 MG tablet, Take 25 mg by mouth at bedtime., Disp: , Rfl:  .  Fish Oil-Cholecalciferol (FISH OIL + D3 PO), Take by mouth., Disp: , Rfl:  .  omeprazole (PRILOSEC) 20 MG capsule, Take 1 capsule (20 mg total) by mouth daily as needed., Disp: 1  capsule, Rfl: 0 .  OVER THE COUNTER MEDICATION, Super Prostate 2 tablets daily, Disp: , Rfl:  .  tadalafil (CIALIS) 5 MG tablet, TAKE 1 TABLET BY MOUTH ONCE A DAY, Disp: 90 tablet, Rfl: 3 .  tamsulosin (FLOMAX) 0.4 MG CAPS capsule, Take 1 capsule (0.4 mg total) by mouth daily., Disp: 1 capsule, Rfl: 0  EXAM:  VITALS per patient if applicable:  GENERAL: alert, oriented, appears well and in no acute distress  HEENT: atraumatic, conjunttiva clear, no obvious abnormalities on inspection of external nose and ears  NECK: normal movements of the head and neck  LUNGS: on inspection no signs of respiratory distress, breathing rate appears normal, no obvious gross SOB, gasping or wheezing  CV: no obvious cyanosis  MS: moves all visible extremities without noticeable abnormality  PSYCH/NEURO: pleasant and cooperative, no obvious depression or anxiety, speech and thought processing grossly intact  ASSESSMENT AND PLAN:  Discussed the following assessment and plan:  Nasal congestion  Body aches  Cough  -we discussed possible serious and likely etiologies, options for evaluation and workup, limitations of telemedicine visit vs in person visit, treatment, treatment risks and precautions. Pt prefers to treat via telemedicine empirically rather than in person at this moment.  Suspect viral illness, influenza, COVID-19 versus other.  Discussed options for testing, treatment, potential complications and precautions.  He opted to do home Covid test, opted against treatment for flu given out of treatment window for likely benefit, short course of nasal  decongestant, nasal saline and other care measures summarized in patient instructions.  Advised prompt follow-up visit through his primary care office or PCP if Covid test was positive and he wished to discuss treatment.  Did discuss treatment window for Covid. Work/School slipped offered:declined Scheduled follow up with PCP offered: He agrees to  schedule follow-up if needed. Advised to seek prompt in person care if worsening, new symptoms arise, or if is not improving with treatment. Discussed options for inperson care if PCP office not available. Did let this patient know that I only do telemedicine on Tuesdays and Thursdays for [place]. Advised to schedule follow up visit with PCP or [facility] if any further questions or concerns to avoid delays in care.   I discussed the assessment and treatment plan with the patient. The patient was provided an opportunity to ask questions and all were answered. The patient agreed with the plan and demonstrated an understanding of the instructions.     Lucretia Kern, DO

## 2020-12-19 NOTE — Patient Instructions (Addendum)
  HOME CARE TIPS:  -[facility] testing information: [email or web address]/ OR 434-672-8524 Most pharmacies also offer testing and home test kits. If you have a positive test and you are interested in treatment please schedule a folow up visit with your primary care office or with [facility]. Treatment for covid is best given in the first 5-7 days.   -can use nasal saline a few times per day if you have nasal congestion; sometimes  a short course of Afrin nasal spray for 3 days can help with symptoms as well  -stay hydrated, drink plenty of fluids and eat small healthy meals - avoid dairy  -can take 1000 IU (35mcg) Vit D3 and 100-500 mg of Vit C daily per instructions  -follow up with your doctor in 2-3 days unless improving and feeling better  -stay home while sick, except to seek medical care, and if you have COVID19 ideally it would be best to stay home for a full 10 days since the onset of symptoms PLUS one day of no fever and feeling better.   It was nice to meet you today, and I really hope you are feeling better soon. I help [facility] out with telemedicine visits on Tuesdays and Thursdays and am available for visits on those days. If you have any concerns or questions following this visit please schedule a follow up visit with your Primary Care doctor or seek care at a local urgent care clinic to avoid delays in care.    Seek in person care or schedule a follow up video visit promptly if your symptoms worsen, new concerns arise or you are not improving with treatment. Call 911 and/or seek emergency care if your symptoms are severe or life threatening.

## 2020-12-25 DIAGNOSIS — R293 Abnormal posture: Secondary | ICD-10-CM | POA: Diagnosis not present

## 2020-12-25 DIAGNOSIS — M25512 Pain in left shoulder: Secondary | ICD-10-CM | POA: Diagnosis not present

## 2020-12-25 DIAGNOSIS — M75112 Incomplete rotator cuff tear or rupture of left shoulder, not specified as traumatic: Secondary | ICD-10-CM | POA: Diagnosis not present

## 2020-12-27 DIAGNOSIS — M75112 Incomplete rotator cuff tear or rupture of left shoulder, not specified as traumatic: Secondary | ICD-10-CM | POA: Diagnosis not present

## 2020-12-27 DIAGNOSIS — M25512 Pain in left shoulder: Secondary | ICD-10-CM | POA: Diagnosis not present

## 2020-12-27 DIAGNOSIS — R293 Abnormal posture: Secondary | ICD-10-CM | POA: Diagnosis not present

## 2021-01-02 DIAGNOSIS — M25512 Pain in left shoulder: Secondary | ICD-10-CM | POA: Diagnosis not present

## 2021-01-02 DIAGNOSIS — R293 Abnormal posture: Secondary | ICD-10-CM | POA: Diagnosis not present

## 2021-01-02 DIAGNOSIS — M75112 Incomplete rotator cuff tear or rupture of left shoulder, not specified as traumatic: Secondary | ICD-10-CM | POA: Diagnosis not present

## 2021-01-03 DIAGNOSIS — R293 Abnormal posture: Secondary | ICD-10-CM | POA: Diagnosis not present

## 2021-01-03 DIAGNOSIS — M25512 Pain in left shoulder: Secondary | ICD-10-CM | POA: Diagnosis not present

## 2021-01-03 DIAGNOSIS — M75112 Incomplete rotator cuff tear or rupture of left shoulder, not specified as traumatic: Secondary | ICD-10-CM | POA: Diagnosis not present

## 2021-01-08 DIAGNOSIS — M75112 Incomplete rotator cuff tear or rupture of left shoulder, not specified as traumatic: Secondary | ICD-10-CM | POA: Diagnosis not present

## 2021-01-08 DIAGNOSIS — M25512 Pain in left shoulder: Secondary | ICD-10-CM | POA: Diagnosis not present

## 2021-01-08 DIAGNOSIS — R293 Abnormal posture: Secondary | ICD-10-CM | POA: Diagnosis not present

## 2021-01-10 DIAGNOSIS — M75112 Incomplete rotator cuff tear or rupture of left shoulder, not specified as traumatic: Secondary | ICD-10-CM | POA: Diagnosis not present

## 2021-01-10 DIAGNOSIS — M25512 Pain in left shoulder: Secondary | ICD-10-CM | POA: Diagnosis not present

## 2021-01-10 DIAGNOSIS — R293 Abnormal posture: Secondary | ICD-10-CM | POA: Diagnosis not present

## 2021-01-15 DIAGNOSIS — M75112 Incomplete rotator cuff tear or rupture of left shoulder, not specified as traumatic: Secondary | ICD-10-CM | POA: Diagnosis not present

## 2021-01-15 DIAGNOSIS — M25512 Pain in left shoulder: Secondary | ICD-10-CM | POA: Diagnosis not present

## 2021-01-15 DIAGNOSIS — R293 Abnormal posture: Secondary | ICD-10-CM | POA: Diagnosis not present

## 2021-01-29 DIAGNOSIS — M25512 Pain in left shoulder: Secondary | ICD-10-CM | POA: Diagnosis not present

## 2021-01-29 DIAGNOSIS — R293 Abnormal posture: Secondary | ICD-10-CM | POA: Diagnosis not present

## 2021-01-29 DIAGNOSIS — M75112 Incomplete rotator cuff tear or rupture of left shoulder, not specified as traumatic: Secondary | ICD-10-CM | POA: Diagnosis not present

## 2021-02-17 ENCOUNTER — Other Ambulatory Visit: Payer: Self-pay | Admitting: Internal Medicine

## 2021-02-26 DIAGNOSIS — R293 Abnormal posture: Secondary | ICD-10-CM | POA: Diagnosis not present

## 2021-02-26 DIAGNOSIS — M25512 Pain in left shoulder: Secondary | ICD-10-CM | POA: Diagnosis not present

## 2021-02-26 DIAGNOSIS — M75112 Incomplete rotator cuff tear or rupture of left shoulder, not specified as traumatic: Secondary | ICD-10-CM | POA: Diagnosis not present

## 2021-05-02 ENCOUNTER — Telehealth: Payer: Self-pay | Admitting: Internal Medicine

## 2021-05-02 ENCOUNTER — Other Ambulatory Visit: Payer: Self-pay | Admitting: Family

## 2021-05-02 DIAGNOSIS — N138 Other obstructive and reflux uropathy: Secondary | ICD-10-CM

## 2021-05-02 DIAGNOSIS — R972 Elevated prostate specific antigen [PSA]: Secondary | ICD-10-CM

## 2021-05-02 DIAGNOSIS — Z125 Encounter for screening for malignant neoplasm of prostate: Secondary | ICD-10-CM

## 2021-05-02 NOTE — Telephone Encounter (Signed)
Spoke to pt. He said it was discussed at the January OV about doing surveillance  every 6 months. Would like PSA ordered.

## 2021-05-02 NOTE — Telephone Encounter (Signed)
Spoke to pt. He has not been to urology in over 6 years. Does not feel he needs urology at this time. Just wants surveillance.

## 2021-05-02 NOTE — Telephone Encounter (Signed)
Craig Jackson called in wanted to know about getting a PSA lab done due to when he was here for his CPE in January it was high.

## 2021-05-05 NOTE — Telephone Encounter (Signed)
Spoke to pt. Made lab appt for tomorrow.

## 2021-05-06 ENCOUNTER — Other Ambulatory Visit (INDEPENDENT_AMBULATORY_CARE_PROVIDER_SITE_OTHER): Payer: Medicare Other

## 2021-05-06 ENCOUNTER — Other Ambulatory Visit: Payer: Self-pay

## 2021-05-06 DIAGNOSIS — N401 Enlarged prostate with lower urinary tract symptoms: Secondary | ICD-10-CM | POA: Diagnosis not present

## 2021-05-06 DIAGNOSIS — Z125 Encounter for screening for malignant neoplasm of prostate: Secondary | ICD-10-CM

## 2021-05-06 DIAGNOSIS — R972 Elevated prostate specific antigen [PSA]: Secondary | ICD-10-CM

## 2021-05-06 DIAGNOSIS — N138 Other obstructive and reflux uropathy: Secondary | ICD-10-CM

## 2021-05-06 LAB — PSA, MEDICARE: PSA: 5.24 ng/ml — ABNORMAL HIGH (ref 0.10–4.00)

## 2021-08-06 DIAGNOSIS — Z23 Encounter for immunization: Secondary | ICD-10-CM | POA: Diagnosis not present

## 2021-10-08 ENCOUNTER — Other Ambulatory Visit: Payer: Self-pay | Admitting: Internal Medicine

## 2021-10-08 DIAGNOSIS — M1000 Idiopathic gout, unspecified site: Secondary | ICD-10-CM

## 2021-10-08 DIAGNOSIS — K219 Gastro-esophageal reflux disease without esophagitis: Secondary | ICD-10-CM

## 2021-10-08 DIAGNOSIS — R972 Elevated prostate specific antigen [PSA]: Secondary | ICD-10-CM

## 2021-10-16 ENCOUNTER — Other Ambulatory Visit: Payer: Self-pay | Admitting: Internal Medicine

## 2021-10-24 ENCOUNTER — Other Ambulatory Visit: Payer: Medicare Other

## 2021-10-24 ENCOUNTER — Other Ambulatory Visit: Payer: Self-pay

## 2021-10-24 ENCOUNTER — Other Ambulatory Visit (INDEPENDENT_AMBULATORY_CARE_PROVIDER_SITE_OTHER): Payer: Medicare Other

## 2021-10-24 DIAGNOSIS — R972 Elevated prostate specific antigen [PSA]: Secondary | ICD-10-CM

## 2021-10-24 DIAGNOSIS — K219 Gastro-esophageal reflux disease without esophagitis: Secondary | ICD-10-CM | POA: Diagnosis not present

## 2021-10-24 DIAGNOSIS — M1000 Idiopathic gout, unspecified site: Secondary | ICD-10-CM | POA: Diagnosis not present

## 2021-10-24 DIAGNOSIS — Z23 Encounter for immunization: Secondary | ICD-10-CM | POA: Diagnosis not present

## 2021-10-24 LAB — COMPREHENSIVE METABOLIC PANEL
ALT: 10 U/L (ref 0–53)
AST: 11 U/L (ref 0–37)
Albumin: 4.3 g/dL (ref 3.5–5.2)
Alkaline Phosphatase: 79 U/L (ref 39–117)
BUN: 15 mg/dL (ref 6–23)
CO2: 28 mEq/L (ref 19–32)
Calcium: 9.5 mg/dL (ref 8.4–10.5)
Chloride: 103 mEq/L (ref 96–112)
Creatinine, Ser: 1.1 mg/dL (ref 0.40–1.50)
GFR: 67.27 mL/min (ref >60.00)
Glucose, Bld: 80 mg/dL (ref 70–99)
Potassium: 4.4 mEq/L (ref 3.5–5.1)
Sodium: 140 mEq/L (ref 135–145)
Total Bilirubin: 1 mg/dL (ref 0.2–1.2)
Total Protein: 6.9 g/dL (ref 6.0–8.3)

## 2021-10-24 LAB — PSA: PSA: 5.73 ng/mL — ABNORMAL HIGH (ref 0.10–4.00)

## 2021-10-24 LAB — CBC
HCT: 39.7 % (ref 39.0–52.0)
Hemoglobin: 12.6 g/dL — ABNORMAL LOW (ref 13.0–17.0)
MCHC: 31.8 g/dL (ref 30.0–36.0)
MCV: 79.5 fl (ref 78.0–100.0)
Platelets: 281 10*3/uL (ref 150.0–400.0)
RBC: 5 Mil/uL (ref 4.22–5.81)
RDW: 19.7 % — ABNORMAL HIGH (ref 11.5–15.5)
WBC: 7.9 10*3/uL (ref 4.0–10.5)

## 2021-10-24 LAB — URIC ACID: Uric Acid, Serum: 7.3 mg/dL (ref 4.0–7.8)

## 2021-10-31 ENCOUNTER — Encounter: Payer: Self-pay | Admitting: Internal Medicine

## 2021-10-31 ENCOUNTER — Other Ambulatory Visit: Payer: Medicare Other

## 2021-10-31 ENCOUNTER — Other Ambulatory Visit: Payer: Self-pay

## 2021-10-31 ENCOUNTER — Ambulatory Visit (INDEPENDENT_AMBULATORY_CARE_PROVIDER_SITE_OTHER): Payer: Medicare Other | Admitting: Internal Medicine

## 2021-10-31 VITALS — BP 124/78 | HR 66 | Temp 97.6°F | Ht 71.0 in | Wt 183.0 lb

## 2021-10-31 DIAGNOSIS — R053 Chronic cough: Secondary | ICD-10-CM | POA: Diagnosis not present

## 2021-10-31 DIAGNOSIS — Z0000 Encounter for general adult medical examination without abnormal findings: Secondary | ICD-10-CM | POA: Diagnosis not present

## 2021-10-31 DIAGNOSIS — M159 Polyosteoarthritis, unspecified: Secondary | ICD-10-CM

## 2021-10-31 DIAGNOSIS — N138 Other obstructive and reflux uropathy: Secondary | ICD-10-CM

## 2021-10-31 DIAGNOSIS — R972 Elevated prostate specific antigen [PSA]: Secondary | ICD-10-CM

## 2021-10-31 DIAGNOSIS — K219 Gastro-esophageal reflux disease without esophagitis: Secondary | ICD-10-CM | POA: Diagnosis not present

## 2021-10-31 DIAGNOSIS — N401 Enlarged prostate with lower urinary tract symptoms: Secondary | ICD-10-CM

## 2021-10-31 DIAGNOSIS — Z1211 Encounter for screening for malignant neoplasm of colon: Secondary | ICD-10-CM | POA: Diagnosis not present

## 2021-10-31 NOTE — Progress Notes (Signed)
Hearing Screening - Comments:: Passed whisper test Vision Screening - Comments:: October 2022  

## 2021-10-31 NOTE — Assessment & Plan Note (Signed)
Discussed trial of antihistamine (cetirizine 10 or fexofenadine 180) at bedtime along with omeprazole 20 to see if it helps

## 2021-10-31 NOTE — Assessment & Plan Note (Signed)
He continues to want to check---advised only up to age 71

## 2021-10-31 NOTE — Assessment & Plan Note (Signed)
I have personally reviewed the Medicare Annual Wellness questionnaire and have noted  1. The patient's medical and social history  2. Their use of alcohol, tobacco or illicit drugs  3. Their current medications and supplements  4. The patient's functional ability including ADL's, fall risks, home safety risks and hearing or visual              impairment.  5. Diet and physical activities  6. Evidence for depression or mood disorders  The patients weight, height, BMI and visual acuity have been recorded in the chart  I have made referrals, counseling and provided education to the patient based review of the above and I have provided the pt with a written personalized care plan for preventive services.   I have provided you with a copy of your personalized plan for preventive services. Please take the time to review along with your updated medication list.  Colon due 2025. Since ongoing anemia (maybe from blood donations)---we will check FIT Td/shingrix at the pharmacy Exercises regularly Did have bivalent COVID and flu vaccines

## 2021-10-31 NOTE — Assessment & Plan Note (Signed)
Can try weaning down on omeprazole daily if cough improves

## 2021-10-31 NOTE — Patient Instructions (Signed)
Please take a nightly antihistamine like cetirizine 10mg  or fexofenadine 180mg . Also take the omeprazole 20mg  nightly for now. If the cough improves, you can see if you can reduce the days you take the omeprazole to see if that is what worked.

## 2021-10-31 NOTE — Progress Notes (Signed)
Subjective:    Patient ID: Craig Jackson, male    DOB: [birth year], 71 y.o.   MRN: [id number]  HPI Here for Medicare wellness visit and follow up of chronic health conditions Reviewed advanced directives Reviewed other doctors---Dr [facility], Dr Lyndon Code, Dr Elenor Quinones No hospitalizations or surgery in the past year Does do regular exercise Vision is good Hearing is good Still enjoys regular beer No tobacco products No falls No depression or anhedonia Independent with instrumental ADLs No memory problems  Did do therapy for shoulder pain--now better No recent injections Some arthritis in hands--especially in the cold Just uses the chondroitin  Continues on cialis daily and tamsulosin Voids well Stream is fairly good Nocturia 0-2   Uses the prilosec prn Helps the heartburn but not drainage and cough (to clear mucus) No allergy symptoms---but antihistamines seem to help No SOB No dysphagia  Current Outpatient Medications on File Prior to Visit  Medication Sig Dispense Refill   CHONDROITIN SULFATE A PO Take 1,600 mg by mouth daily.     omeprazole (PRILOSEC) 20 MG capsule TAKE 1 CAPSULE BY MOUTH EVERY DAY 90 capsule 0   OVER THE COUNTER MEDICATION Super Prostate 2 tablets daily     tadalafil (CIALIS) 5 MG tablet TAKE 1 TABLET BY MOUTH ONCE A DAY 90 tablet 3   tamsulosin (FLOMAX) 0.4 MG CAPS capsule TAKE 1 CAPSULE BY MOUTH 2 TIMES DAILY. 180 capsule 2   No current facility-administered medications on file prior to visit.    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   GERD (gastroesophageal reflux disease)    Gout     Past Surgical History:  Procedure Laterality Date   APPENDECTOMY     CATARACT EXTRACTION     EYE SURGERY  2016   detached retina   ROTATOR CUFF REPAIR     SHOULDER  SURGERY Left 1972   separation    Family History  Problem Relation Age of Onset   Arthritis Mother    Atrial fibrillation Father    Depression Sister        from chronic pain   Arthritis Sister    Heart disease Neg Hx    Diabetes Neg Hx    Cancer Neg Hx     Social History   Socioeconomic History   Marital status: Married    Spouse name: Not on file   Number of children: 1   Years of education: Not on file   Highest education level: Not on file  Occupational History   Occupation: Materials engineer    Comment: retired  Tobacco Use   Smoking status: Never   Smokeless tobacco: Former    Types: Chew    Quit date: 09/03/2017   Tobacco comments:    only occasionally  Substance and Sexual Activity   Alcohol use: Yes    Alcohol/week: 2.0 standard drinks    Types: 2 Cans of beer per week   Drug use: No   Sexual activity: Never  Other Topics Concern   Not on file  Social History Narrative   1 daughter---local   State graduate      Has living will   Wife is health care POA--daughter is alternate   Would accept resuscitation   Not sure about feeding tube   Social Determinants of Health  Financial Resource Strain: Not on file  Food Insecurity: Not on file  Transportation Needs: Not on file  Physical Activity: Not on file  Stress: Not on file  Social Connections: Not on file  Intimate Partner Violence: Not on file   Review of Systems Appetite is good Weight stable Sleeps well Wears seat belt Teeth are okay Bowels move okay--no blood Reviewed the anemia----gives blood --double RBC regularly (usually 14 when he checks) No chest pain or SOB No dizziness or syncope No suspicious skin lesions     Objective:   Physical Exam Constitutional:      Appearance: Normal appearance.  HENT:     Mouth/Throat:     Comments: No lesions Eyes:     Conjunctiva/sclera: Conjunctivae normal.     Pupils: Pupils are equal, round, and reactive to light.  Cardiovascular:      Rate and Rhythm: Normal rate and regular rhythm.     Pulses: Normal pulses.     Heart sounds: No murmur heard.   No gallop.  Pulmonary:     Effort: Pulmonary effort is normal.     Breath sounds: Normal breath sounds. No wheezing or rales.  Abdominal:     Palpations: Abdomen is soft.     Tenderness: There is no abdominal tenderness.  Musculoskeletal:     Cervical back: Neck supple.     Right lower leg: No edema.     Left lower leg: No edema.  Lymphadenopathy:     Cervical: No cervical adenopathy.  Skin:    Findings: No lesion or rash.  Neurological:     General: No focal deficit present.     Mental Status: He is alert and oriented to person, place, and time.     Comments: Mini-cog normal  Psychiatric:        Mood and Affect: Mood normal.        Behavior: Behavior normal.           Assessment & Plan:

## 2021-10-31 NOTE — Assessment & Plan Note (Signed)
Shoulders better Still with hands Only uses chondroitin

## 2021-10-31 NOTE — Assessment & Plan Note (Signed)
Happy with the cialis 5mg  and tamsulosin 0.4mg  daily

## 2021-11-04 ENCOUNTER — Other Ambulatory Visit (INDEPENDENT_AMBULATORY_CARE_PROVIDER_SITE_OTHER): Payer: Medicare Other

## 2021-11-04 DIAGNOSIS — Z1211 Encounter for screening for malignant neoplasm of colon: Secondary | ICD-10-CM | POA: Diagnosis not present

## 2021-11-04 LAB — FECAL OCCULT BLOOD, IMMUNOCHEMICAL: Fecal Occult Bld: NEGATIVE

## 2022-01-12 ENCOUNTER — Other Ambulatory Visit: Payer: Self-pay | Admitting: Internal Medicine

## 2022-01-28 ENCOUNTER — Other Ambulatory Visit: Payer: Self-pay | Admitting: Internal Medicine

## 2022-02-23 ENCOUNTER — Other Ambulatory Visit: Payer: Self-pay | Admitting: Internal Medicine

## 2022-08-10 ENCOUNTER — Other Ambulatory Visit: Payer: Self-pay | Admitting: Internal Medicine

## 2022-08-15 DIAGNOSIS — Z23 Encounter for immunization: Secondary | ICD-10-CM | POA: Diagnosis not present

## 2022-10-15 ENCOUNTER — Other Ambulatory Visit: Payer: Self-pay | Admitting: Internal Medicine

## 2022-10-15 DIAGNOSIS — K219 Gastro-esophageal reflux disease without esophagitis: Secondary | ICD-10-CM

## 2022-10-15 DIAGNOSIS — M1000 Idiopathic gout, unspecified site: Secondary | ICD-10-CM

## 2022-10-15 DIAGNOSIS — R972 Elevated prostate specific antigen [PSA]: Secondary | ICD-10-CM

## 2022-10-30 ENCOUNTER — Other Ambulatory Visit (INDEPENDENT_AMBULATORY_CARE_PROVIDER_SITE_OTHER): Payer: Medicare Other

## 2022-10-30 DIAGNOSIS — M1000 Idiopathic gout, unspecified site: Secondary | ICD-10-CM

## 2022-10-30 DIAGNOSIS — R972 Elevated prostate specific antigen [PSA]: Secondary | ICD-10-CM | POA: Diagnosis not present

## 2022-10-30 DIAGNOSIS — K219 Gastro-esophageal reflux disease without esophagitis: Secondary | ICD-10-CM | POA: Diagnosis not present

## 2022-10-30 LAB — HEPATIC FUNCTION PANEL
ALT: 10 U/L (ref 0–53)
AST: 11 U/L (ref 0–37)
Albumin: 4.2 g/dL (ref 3.5–5.2)
Alkaline Phosphatase: 75 U/L (ref 39–117)
Bilirubin, Direct: 0.2 mg/dL (ref 0.0–0.3)
Total Bilirubin: 1 mg/dL (ref 0.2–1.2)
Total Protein: 6.8 g/dL (ref 6.0–8.3)

## 2022-10-30 LAB — CBC
HCT: 37.8 % — ABNORMAL LOW (ref 39.0–52.0)
Hemoglobin: 12.3 g/dL — ABNORMAL LOW (ref 13.0–17.0)
MCHC: 32.5 g/dL (ref 30.0–36.0)
MCV: 78.4 fl (ref 78.0–100.0)
Platelets: 285 10*3/uL (ref 150.0–400.0)
RBC: 4.82 Mil/uL (ref 4.22–5.81)
RDW: 18.9 % — ABNORMAL HIGH (ref 11.5–15.5)
WBC: 6.8 10*3/uL (ref 4.0–10.5)

## 2022-10-30 LAB — RENAL FUNCTION PANEL
Albumin: 4.2 g/dL (ref 3.5–5.2)
BUN: 16 mg/dL (ref 6–23)
CO2: 28 mEq/L (ref 19–32)
Calcium: 9.1 mg/dL (ref 8.4–10.5)
Chloride: 105 mEq/L (ref 96–112)
Creatinine, Ser: 0.95 mg/dL (ref 0.40–1.50)
GFR: 79.64 mL/min (ref >60.00)
Glucose, Bld: 92 mg/dL (ref 70–99)
Phosphorus: 3.6 mg/dL (ref 2.3–4.6)
Potassium: 4.4 mEq/L (ref 3.5–5.1)
Sodium: 140 mEq/L (ref 135–145)

## 2022-10-30 LAB — PSA: PSA: 8.29 ng/mL — ABNORMAL HIGH (ref 0.10–4.00)

## 2022-10-30 LAB — URIC ACID: Uric Acid, Serum: 7.2 mg/dL (ref 4.0–7.8)

## 2022-11-03 ENCOUNTER — Ambulatory Visit (INDEPENDENT_AMBULATORY_CARE_PROVIDER_SITE_OTHER): Payer: Medicare Other | Admitting: Internal Medicine

## 2022-11-03 ENCOUNTER — Encounter: Payer: Self-pay | Admitting: Internal Medicine

## 2022-11-03 VITALS — BP 126/78 | HR 69 | Temp 97.2°F | Ht 71.25 in | Wt 186.0 lb

## 2022-11-03 DIAGNOSIS — M159 Polyosteoarthritis, unspecified: Secondary | ICD-10-CM | POA: Diagnosis not present

## 2022-11-03 DIAGNOSIS — R972 Elevated prostate specific antigen [PSA]: Secondary | ICD-10-CM

## 2022-11-03 DIAGNOSIS — K219 Gastro-esophageal reflux disease without esophagitis: Secondary | ICD-10-CM | POA: Diagnosis not present

## 2022-11-03 DIAGNOSIS — Z0000 Encounter for general adult medical examination without abnormal findings: Secondary | ICD-10-CM | POA: Diagnosis not present

## 2022-11-03 DIAGNOSIS — N138 Other obstructive and reflux uropathy: Secondary | ICD-10-CM | POA: Diagnosis not present

## 2022-11-03 DIAGNOSIS — N401 Enlarged prostate with lower urinary tract symptoms: Secondary | ICD-10-CM | POA: Diagnosis not present

## 2022-11-03 NOTE — Assessment & Plan Note (Signed)
Voids okay with the tadalafil '5mg'$  daily and tamsulosin 0.'4mg'$  daily

## 2022-11-03 NOTE — Progress Notes (Signed)
Subjective:    Patient ID: Craig Jackson, male    DOB: [birth year], 72 y.o.   MRN: [id number]  HPI Here for Medicare wellness visit and follow up of chronic health conditions Reviewed advanced directives Reviewed other doctors---Dr Matthews--ophthal, Dr Lenox Ponds No hospitalizations or surgery in the past year Vision is fine Hearing is fine Exercises regularly---walks mostly (has weights at home) Still enjoys beer --- 1-2 daily No tobacco No falls No depression or --some grieving for mom (died in 09-17-23) Independent with instrumental ADLs No memory issues  Reviewed the elevated PSA Had been as high as 10 almost 20 years ago--?prostatitis 2 biopsies by Dr Richardson Landry in [place] No prostate symptoms Nocturia 0-2--depending on fluid intake Flow is okay--finds the tadalafil does help  GERD is controlled Found that he is triggered by OJ Uses the omeprazole as prn/preventation No dysphagia or cough  Joints are good Still gets some hand pain in cold weather Occasional aleve---especially for left hip bursitis Uses topical diclofenac at times as wel  Current Outpatient Medications on File Prior to Visit  Medication Sig Dispense Refill   CHONDROITIN SULFATE A PO Take 1,600 mg by mouth daily.     omeprazole (PRILOSEC) 20 MG capsule TAKE 1 CAPSULE BY MOUTH EVERY DAY 90 capsule 0   OVER THE COUNTER MEDICATION Super Prostate 2 tablets daily     tadalafil (CIALIS) 5 MG tablet TAKE 1 TABLET BY MOUTH ONCE DAILY 90 tablet 3   tamsulosin (FLOMAX) 0.4 MG CAPS capsule TAKE 1 CAPSULE BY MOUTH TWICE A DAY 180 capsule 3   No current facility-administered medications on file prior to visit.    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   GERD (gastroesophageal reflux disease)    Gout     Past Surgical History:   Procedure Laterality Date   APPENDECTOMY     CATARACT EXTRACTION     EYE SURGERY  2016   detached retina   ROTATOR CUFF REPAIR     SHOULDER SURGERY Left 1972   separation    Family History  Problem Relation Age of Onset   Arthritis Mother    Atrial fibrillation Father    Depression Sister        from chronic pain   Arthritis Sister    Dementia Sister    Heart disease Neg Hx    Diabetes Neg Hx    Cancer Neg Hx     Social History   Socioeconomic History   Marital status: Married    Spouse name: Not on file   Number of children: 1   Years of education: Not on file   Highest education level: Not on file  Occupational History   Occupation: Materials engineer    Comment: retired  Tobacco Use   Smoking status: Never    Passive exposure: Never   Smokeless tobacco: Former    Types: Chew    Quit date: 09/03/2017   Tobacco comments:    only occasionally  Vaping Use   Vaping Use: Never used  Substance and Sexual Activity   Alcohol use: Yes    Alcohol/week: 2.0 standard drinks of alcohol    Types: 2 Cans of beer per week   Drug use: No   Sexual activity: Never  Other Topics Concern   Not on file  Social History Narrative   1 Market researcher      Has living will   Wife is health care POA--daughter is alternate   Would accept resuscitation   Not sure about feeding tube   Social Determinants of Health   Financial Resource Strain: Not on file  Food Insecurity: Not on file  Transportation Needs: Not on file  Physical Activity: Not on file  Stress: Not on file  Social Connections: Not on file  Intimate Partner Violence: Not on file   Review of Systems Appetite is fine Weight is stable Teeth okay--keeps up with dentist Some skin areas he wants checked--plans to go to wife's dermatologist Wears seat belt    Objective:   Physical Exam Constitutional:      Appearance: Normal appearance.  HENT:     Mouth/Throat:     Pharynx: No  oropharyngeal exudate or posterior oropharyngeal erythema.  Eyes:     Conjunctiva/sclera: Conjunctivae normal.     Pupils: Pupils are equal, round, and reactive to light.  Cardiovascular:     Rate and Rhythm: Normal rate and regular rhythm.     Pulses: Normal pulses.     Heart sounds:     No gallop.  Pulmonary:     Effort: Pulmonary effort is normal.     Breath sounds: Normal breath sounds. No wheezing or rales.  Abdominal:     Palpations: Abdomen is soft.     Tenderness: There is no abdominal tenderness.  Musculoskeletal:     Cervical back: Neck supple.     Right lower leg: No edema.     Left lower leg: No edema.  Lymphadenopathy:     Cervical: No cervical adenopathy.  Skin:    Findings: No lesion or rash.  Neurological:     General: No focal deficit present.     Mental Status: He is alert and oriented to person, place, and time.     Comments: Word naming --- 10/1 minute Recall 3/3  Psychiatric:        Mood and Affect: Mood normal.        Behavior: Behavior normal.            Assessment & Plan:

## 2022-11-03 NOTE — Assessment & Plan Note (Signed)
Doing better with careful eating Uses the omeprazole only prn

## 2022-11-03 NOTE — Assessment & Plan Note (Signed)
I have personally reviewed the Medicare Annual Wellness questionnaire and have noted 1. The patient's medical and social history 2. Their use of alcohol, tobacco or illicit drugs 3. Their current medications and supplements 4. The patient's functional ability including ADL's, fall risks, home safety risks and hearing or visual             impairment. 5. Diet and physical activities 6. Evidence for depression or mood disorders  The patients weight, height, BMI and visual acuity have been recorded in the chart I have made referrals, counseling and provided education to the patient based review of the above and I have provided the pt with a written personalized care plan for preventive services.  I have provided you with a copy of your personalized plan for preventive services. Please take the time to review along with your updated medication list.  Does exercise--discussed adding resistance One last screening colon next year Td/shingrix at pharmacy Had updated COVID/flu vaccines Consider RSV in the fall

## 2022-11-03 NOTE — Assessment & Plan Note (Signed)
Elevations go back almost 20 years Still higher now than in the past few years Will recheck in 6 months---[facility] if goes up

## 2022-11-03 NOTE — Assessment & Plan Note (Signed)
Uses diclofenac and rare aleve

## 2022-11-03 NOTE — Progress Notes (Signed)
Hearing Screening - Comments:: Passed whisper test Vision Screening - Comments:: November 2023  

## 2023-01-06 ENCOUNTER — Other Ambulatory Visit: Payer: Self-pay | Admitting: Internal Medicine

## 2023-02-17 ENCOUNTER — Encounter (HOSPITAL_COMMUNITY): Payer: Self-pay | Admitting: Family Medicine

## 2023-02-17 ENCOUNTER — Inpatient Hospital Stay (HOSPITAL_COMMUNITY)
Admission: EM | Admit: 2023-02-17 | Discharge: 2023-02-19 | DRG: 392 | Disposition: A | Discharge status: Home Health | Payer: Medicare Other | Attending: Internal Medicine | Admitting: Internal Medicine

## 2023-02-17 ENCOUNTER — Emergency Department (HOSPITAL_COMMUNITY): Payer: Medicare Other

## 2023-02-17 DIAGNOSIS — E876 Hypokalemia: Secondary | ICD-10-CM | POA: Diagnosis present

## 2023-02-17 DIAGNOSIS — K5289 Other specified noninfective gastroenteritis and colitis: Secondary | ICD-10-CM | POA: Diagnosis not present

## 2023-02-17 DIAGNOSIS — K4090 Unilateral inguinal hernia, without obstruction or gangrene, not specified as recurrent: Secondary | ICD-10-CM

## 2023-02-17 DIAGNOSIS — N3289 Other specified disorders of bladder: Secondary | ICD-10-CM

## 2023-02-17 DIAGNOSIS — R338 Other retention of urine: Secondary | ICD-10-CM | POA: Diagnosis not present

## 2023-02-17 DIAGNOSIS — E279 Disorder of adrenal gland, unspecified: Secondary | ICD-10-CM | POA: Diagnosis present

## 2023-02-17 DIAGNOSIS — F1020 Alcohol dependence, uncomplicated: Secondary | ICD-10-CM | POA: Diagnosis not present

## 2023-02-17 DIAGNOSIS — R1030 Lower abdominal pain, unspecified: Secondary | ICD-10-CM

## 2023-02-17 DIAGNOSIS — E278 Other specified disorders of adrenal gland: Secondary | ICD-10-CM | POA: Diagnosis present

## 2023-02-17 DIAGNOSIS — I5032 Chronic diastolic (congestive) heart failure: Secondary | ICD-10-CM | POA: Diagnosis present

## 2023-02-17 DIAGNOSIS — Z818 Family history of other mental and behavioral disorders: Secondary | ICD-10-CM

## 2023-02-17 DIAGNOSIS — K219 Gastro-esophageal reflux disease without esophagitis: Secondary | ICD-10-CM | POA: Diagnosis present

## 2023-02-17 DIAGNOSIS — R5381 Other malaise: Secondary | ICD-10-CM | POA: Diagnosis present

## 2023-02-17 DIAGNOSIS — Z91018 Allergy to other foods: Secondary | ICD-10-CM

## 2023-02-17 DIAGNOSIS — Z87891 Personal history of nicotine dependence: Secondary | ICD-10-CM

## 2023-02-17 DIAGNOSIS — Z8261 Family history of arthritis: Secondary | ICD-10-CM

## 2023-02-17 DIAGNOSIS — Z887 Allergy status to serum and vaccine status: Secondary | ICD-10-CM

## 2023-02-17 DIAGNOSIS — H547 Unspecified visual loss: Secondary | ICD-10-CM | POA: Diagnosis present

## 2023-02-17 DIAGNOSIS — N401 Enlarged prostate with lower urinary tract symptoms: Secondary | ICD-10-CM | POA: Diagnosis present

## 2023-02-17 DIAGNOSIS — Z79899 Other long term (current) drug therapy: Secondary | ICD-10-CM

## 2023-02-17 DIAGNOSIS — R339 Retention of urine, unspecified: Secondary | ICD-10-CM | POA: Diagnosis present

## 2023-02-17 DIAGNOSIS — M1990 Unspecified osteoarthritis, unspecified site: Secondary | ICD-10-CM | POA: Diagnosis present

## 2023-02-17 LAB — COMPREHENSIVE METABOLIC PANEL
ALT: 18 U/L (ref 0–44)
AST: 21 U/L (ref 15–41)
Albumin: 3.8 g/dL (ref 3.5–5.0)
Alkaline Phosphatase: 74 U/L (ref 38–126)
Anion gap: 14 (ref 5–15)
BUN: 11 mg/dL (ref 8–23)
CO2: 25 mmol/L (ref 22–32)
Calcium: 9 mg/dL (ref 8.9–10.3)
Chloride: 97 mmol/L — ABNORMAL LOW (ref 98–111)
Creatinine, Ser: 0.96 mg/dL (ref 0.61–1.24)
GFR, Estimated: >60 mL/min (ref >60)
Glucose, Bld: 132 mg/dL — ABNORMAL HIGH (ref 70–99)
Potassium: 2.4 mmol/L — CL (ref 3.5–5.1)
Sodium: 136 mmol/L (ref 135–145)
Total Bilirubin: 0.7 mg/dL (ref 0.3–1.2)
Total Protein: 7.2 g/dL (ref 6.5–8.1)

## 2023-02-17 LAB — MAGNESIUM: Magnesium: 1.6 mg/dL — ABNORMAL LOW (ref 1.7–2.4)

## 2023-02-17 LAB — CBC WITH DIFFERENTIAL/PLATELET
Abs Immature Granulocytes: 0.1 10*3/uL — ABNORMAL HIGH (ref 0.00–0.07)
Basophils Absolute: 0.1 10*3/uL (ref 0.0–0.1)
Basophils Relative: 0 %
Eosinophils Absolute: 0.1 10*3/uL (ref 0.0–0.5)
Eosinophils Relative: 1 %
HCT: 42 % (ref 39.0–52.0)
Hemoglobin: 14.4 g/dL (ref 13.0–17.0)
Immature Granulocytes: 1 %
Lymphocytes Relative: 14 %
Lymphs Abs: 2.1 10*3/uL (ref 0.7–4.0)
MCH: 30.1 pg (ref 26.0–34.0)
MCHC: 34.3 g/dL (ref 30.0–36.0)
MCV: 87.7 fL (ref 80.0–100.0)
Monocytes Absolute: 1.2 10*3/uL — ABNORMAL HIGH (ref 0.1–1.0)
Monocytes Relative: 8 %
Neutro Abs: 11.4 10*3/uL — ABNORMAL HIGH (ref 1.7–7.7)
Neutrophils Relative %: 76 %
Platelets: 385 10*3/uL (ref 150–400)
RBC: 4.79 MIL/uL (ref 4.22–5.81)
RDW: 12.8 % (ref 11.5–15.5)
WBC: 15 10*3/uL — ABNORMAL HIGH (ref 4.0–10.5)
nRBC: 0 % (ref 0.0–0.2)

## 2023-02-17 LAB — LIPASE, BLOOD: Lipase: 25 U/L (ref 11–51)

## 2023-02-17 MED ORDER — THIAMINE MONONITRATE 100 MG PO TABS
100.0000 mg | ORAL_TABLET | Freq: Every day | ORAL | Status: DC
  Administered 2023-02-17 – 2023-02-19 (×3): 100 mg via ORAL
  Filled 2023-02-17 (×3): qty 1

## 2023-02-17 MED ORDER — ADULT MULTIVITAMIN W/MINERALS CH
1.0000 | ORAL_TABLET | Freq: Every day | ORAL | Status: DC
  Administered 2023-02-17 – 2023-02-19 (×3): 1 via ORAL
  Filled 2023-02-17 (×3): qty 1

## 2023-02-17 MED ORDER — TAMSULOSIN HCL 0.4 MG PO CAPS
0.4000 mg | ORAL_CAPSULE | Freq: Two times a day (BID) | ORAL | Status: DC
  Administered 2023-02-18 – 2023-02-19 (×4): 0.4 mg via ORAL
  Filled 2023-02-17 (×4): qty 1

## 2023-02-17 MED ORDER — MINERAL OIL RE ENEM
1.0000 | ENEMA | Freq: Once | RECTAL | Status: AC
  Administered 2023-02-17: 1 via RECTAL
  Filled 2023-02-17: qty 1

## 2023-02-17 MED ORDER — ACETAMINOPHEN 650 MG RE SUPP: 650.0000 mg | Freq: Four times a day (QID) | RECTAL | Status: DC | PRN

## 2023-02-17 MED ORDER — LORAZEPAM 1 MG PO TABS: 0.0000 mg | ORAL_TABLET | Freq: Two times a day (BID) | ORAL | Status: DC

## 2023-02-17 MED ORDER — ONDANSETRON HCL 4 MG PO TABS: 4.0000 mg | ORAL_TABLET | Freq: Four times a day (QID) | ORAL | Status: DC | PRN

## 2023-02-17 MED ORDER — LORAZEPAM 2 MG/ML IJ SOLN: 1.0000 mg | INTRAMUSCULAR | Status: DC | PRN

## 2023-02-17 MED ORDER — POLYETHYLENE GLYCOL 3350 17 G PO PACK
17.0000 g | PACK | Freq: Two times a day (BID) | ORAL | Status: DC
  Administered 2023-02-19: 17 g via ORAL
  Filled 2023-02-17 (×3): qty 1

## 2023-02-17 MED ORDER — THIAMINE HCL 100 MG/ML IJ SOLN: 100.0000 mg | Freq: Every day | INTRAMUSCULAR | Status: DC

## 2023-02-17 MED ORDER — ONDANSETRON HCL 4 MG/2ML IJ SOLN: 4.0000 mg | Freq: Four times a day (QID) | INTRAMUSCULAR | Status: DC | PRN

## 2023-02-17 MED ORDER — POTASSIUM CHLORIDE CRYS ER 20 MEQ PO TBCR
40.0000 meq | EXTENDED_RELEASE_TABLET | Freq: Once | ORAL | Status: AC
  Administered 2023-02-17: 40 meq via ORAL
  Filled 2023-02-17: qty 2

## 2023-02-17 MED ORDER — SODIUM CHLORIDE 0.9% FLUSH
3.0000 mL | Freq: Two times a day (BID) | INTRAVENOUS | Status: DC
  Administered 2023-02-18 – 2023-02-19 (×4): 3 mL via INTRAVENOUS

## 2023-02-17 MED ORDER — POTASSIUM CHLORIDE 10 MEQ/100ML IV SOLN
10.0000 meq | INTRAVENOUS | Status: DC
  Administered 2023-02-17: 10 meq via INTRAVENOUS
  Filled 2023-02-17: qty 100

## 2023-02-17 MED ORDER — LORAZEPAM 1 MG PO TABS
0.0000 mg | ORAL_TABLET | Freq: Four times a day (QID) | ORAL | Status: DC
  Administered 2023-02-19: 1 mg via ORAL
  Filled 2023-02-17: qty 1

## 2023-02-17 MED ORDER — LORAZEPAM 1 MG PO TABS: 1.0000 mg | ORAL_TABLET | ORAL | Status: DC | PRN

## 2023-02-17 MED ORDER — ACETAMINOPHEN 325 MG PO TABS: 650.0000 mg | ORAL_TABLET | Freq: Four times a day (QID) | ORAL | Status: DC | PRN

## 2023-02-17 MED ORDER — FOLIC ACID 1 MG PO TABS
1.0000 mg | ORAL_TABLET | Freq: Every day | ORAL | Status: DC
  Administered 2023-02-17 – 2023-02-19 (×3): 1 mg via ORAL
  Filled 2023-02-17 (×3): qty 1

## 2023-02-17 MED ORDER — POTASSIUM CHLORIDE IN NACL 20-0.9 MEQ/L-% IV SOLN
INTRAVENOUS | Status: DC
  Filled 2023-02-17 (×2): qty 1000

## 2023-02-17 MED ORDER — ENOXAPARIN SODIUM 40 MG/0.4ML IJ SOSY
40.0000 mg | PREFILLED_SYRINGE | INTRAMUSCULAR | Status: DC
  Administered 2023-02-17 – 2023-02-18 (×2): 40 mg via SUBCUTANEOUS
  Filled 2023-02-17 (×2): qty 0.4

## 2023-02-17 NOTE — ED Provider Triage Note (Signed)
Emergency Medicine Provider Triage Evaluation Note  Jeremiah Foster , a 72 y.o. male  was evaluated in triage.  Pt complains of resolved lower abdominal pain onset earlier today. Denies abdominal pain currently. No nausea at this time. Denies chest pain, shortness of breath, urinary symptoms. Still has gallbladder and appendix.   Review of Systems  Positive:  Negative:   Physical Exam  BP 121/74 (BP Location: Right Arm)   Pulse (!) 115   Temp 98.9 F (37.2 C) (Oral)   Resp (!) 22   SpO2 100%  Gen:   Awake, no distress   Resp:  Normal effort  MSK:   Moves extremities without difficulty  Other:  No abdominal TTP.   Medical Decision Making  Medically screening exam initiated at 6:01 PM.  Appropriate orders placed.  Jeremiah Foster was informed that the remainder of the evaluation will be completed by another provider, this initial triage assessment does not replace that evaluation, and the importance of remaining in the ED until their evaluation is complete.  Work-up initiated.    Jeremiah Foster A, PA-C 02/17/23 1802

## 2023-02-17 NOTE — H&P (Addendum)
History and Physical    Maxey Harry Schnee MRN:[id number] DOB: 07/03/1951 DOA: 02/17/2023  PCP: System, Provider Not In   Patient coming from: Home   Chief Complaint: Lower abdominal pain    HPI: Jeremiah Foster is a 72 y.o. male with medical history significant for BPH, GERD, osteoarthritis, chronic diastolic CHF, and alcohol abuse, now presenting for evaluation of lower abdominal pain.  Patient developed lower abdominal pain with nausea today and was noted by his home health RN to be diaphoretic and tachycardic.  Patient reports that he has been experiencing small amounts of liquid stool for weeks now with occasional pain in his lower abdomen.  He has had nausea associated with this, but no vomiting.  He reports drinking 1-2 beers daily but his daughter reports that he drinks much more than this and has been an alcoholic for many years.  ED Course: Upon arrival to the ED, patient is found to be afebrile and saturating well on room air with mild tachycardia and stable blood pressure.  EKG demonstrates sinus tachycardia.  Labs are most notable for potassium 2.4 and WBC 15,000.  CT of the abdomen and pelvis is concerning for stercoral colitis, marked urinary bladder distention, inguinal hernia, and adrenal nodule.  Patient was treated with oral and IV potassium in the emergency department and Foley catheter was ordered.  Review of Systems:  All other systems reviewed and apart from HPI, are negative.  Past Medical History:  Diagnosis Date   Arthritis    BPH with obstruction/lower urinary tract symptoms    Elevated PSA    Generalized osteoarthritis of multiple sites    mostly hands   GERD (gastroesophageal reflux disease)    Gout     Past Surgical History:  Procedure Laterality Date   APPENDECTOMY     CATARACT EXTRACTION     EYE SURGERY  2016   detached retina   ROTATOR CUFF REPAIR     SHOULDER SURGERY Left 1972   separation    Social History:   reports that he has  never smoked. He has never been exposed to tobacco smoke. He quit smokeless tobacco use about 5 years ago.  His smokeless tobacco use included chew. He reports current alcohol use of about 2.0 standard drinks of alcohol per week. He reports that he does not use drugs.  No Known Allergies  Family History  Problem Relation Age of Onset   Arthritis Mother    Atrial fibrillation Father    Depression Sister        from chronic pain   Arthritis Sister    Dementia Sister    Heart disease Neg Hx    Diabetes Neg Hx    Cancer Neg Hx      Prior to Admission medications   Medication Sig Start Date End Date Taking? Authorizing Provider  CHONDROITIN SULFATE A PO Take 1,600 mg by mouth daily.    [provider]  omeprazole (PRILOSEC) 20 MG capsule TAKE 1 CAPSULE BY MOUTH EVERY DAY 08/10/22   Letvak, Richard I, MD  OVER THE COUNTER MEDICATION Super Prostate 2 tablets daily    [provider]  tadalafil (CIALIS) 5 MG tablet TAKE ONE TABLET BY MOUTH ONCE DAILY 01/06/23   Letvak, Richard I, MD  tamsulosin (FLOMAX) 0.4 MG CAPS capsule TAKE 1 CAPSULE BY MOUTH TWICE A DAY 02/23/22   Letvak, Richard I, MD    Physical Exam: Vitals:   02/17/23 1719 02/17/23 1857  BP: 121/74 137/78  Pulse: (!)   115 (!) 102  Resp: (!) 22 19  Temp: 98.9 F (37.2 C)   TempSrc: Oral   SpO2: 100% 100%     Constitutional: NAD, no pallor or diaphoresis   Eyes: PERTLA, lids and conjunctivae normal ENMT: Mucous membranes are moist. Posterior pharynx clear of any exudate or lesions.   Neck: supple, no masses  Respiratory: no wheezing, no crackles. No accessory muscle use.  Cardiovascular: S1 & S2 heard, regular rate and rhythm. Mild LE edema.   Abdomen: Soft. Tender in LLQ without rebound pain or guarding. Bowel sounds active.  Musculoskeletal: no clubbing / cyanosis. No joint deformity upper and lower extremities.   Skin: no significant rashes, lesions, ulcers. Warm, dry, well-perfused. Neurologic: CN 2-12  grossly intact. Moving all extremities. Alert and oriented.  Psychiatric: Calm. Cooperative.    Labs and Imaging on Admission: I have personally reviewed following labs and imaging studies  CBC: Recent Labs  Lab 02/17/23 1805  WBC 15.0*  NEUTROABS 11.4*  HGB 14.4  HCT 42.0  MCV 87.7  PLT 385   Basic Metabolic Panel: Recent Labs  Lab 02/17/23 1805  NA 136  K 2.4*  CL 97*  CO2 25  GLUCOSE 132*  BUN 11  CREATININE 0.96  CALCIUM 9.0   GFR: CrCl cannot be calculated (Unknown ideal weight.). Liver Function Tests: Recent Labs  Lab 02/17/23 1805  AST 21  ALT 18  ALKPHOS 74  BILITOT 0.7  PROT 7.2  ALBUMIN 3.8   Recent Labs  Lab 02/17/23 1805  LIPASE 25   No results for input(s): "AMMONIA" in the last 168 hours. Coagulation Profile: No results for input(s): "INR", "PROTIME" in the last 168 hours. Cardiac Enzymes: No results for input(s): "CKTOTAL", "CKMB", "CKMBINDEX", "TROPONINI" in the last 168 hours. BNP (last 3 results) No results for input(s): "PROBNP" in the last 8760 hours. HbA1C: No results for input(s): "HGBA1C" in the last 72 hours. CBG: No results for input(s): "GLUCAP" in the last 168 hours. Lipid Profile: No results for input(s): "CHOL", "HDL", "LDLCALC", "TRIG", "CHOLHDL", "LDLDIRECT" in the last 72 hours. Thyroid Function Tests: No results for input(s): "TSH", "T4TOTAL", "FREET4", "T3FREE", "THYROIDAB" in the last 72 hours. Anemia Panel: No results for input(s): "VITAMINB12", "FOLATE", "FERRITIN", "TIBC", "IRON", "RETICCTPCT" in the last 72 hours. Urine analysis: No results found for: "COLORURINE", "APPEARANCEUR", "LABSPEC", "PHURINE", "GLUCOSEU", "HGBUR", "BILIRUBINUR", "KETONESUR", "PROTEINUR", "UROBILINOGEN", "NITRITE", "LEUKOCYTESUR" Sepsis Labs: @LABRCNTIP(procalcitonin:4,lacticidven:4) )No results found for this or any previous visit (from the past 240 hour(s)).   Radiological Exams on Admission: CT Renal Stone Study  Result Date:  02/17/2023 CLINICAL DATA:  Abdominal and flank pain EXAM: CT ABDOMEN AND PELVIS WITHOUT CONTRAST TECHNIQUE: Multidetector CT imaging of the abdomen and pelvis was performed following the standard protocol without IV contrast. RADIATION DOSE REDUCTION: This exam was performed according to the departmental dose-optimization program which includes automated exposure control, adjustment of the mA and/or kV according to patient size and/or use of iterative reconstruction technique. COMPARISON:  None Available. FINDINGS: Lower chest: No acute abnormality. Hepatobiliary: No focal liver abnormality is seen. No gallstones, gallbladder wall thickening, or biliary dilatation. Pancreas: There is diffuse fatty infiltration of the pancreas. No acute abnormality. Spleen: Normal in size without focal abnormality. Adrenals/Urinary Tract: There are 2 small right adrenal nodules which are indeterminate. These measure 1 cm. The left adrenal gland bilateral kidneys appear within normal limits. Bladder is markedly distended. Stomach/Bowel: Rectum is dilated and stool-filled. There is rectal wall thickening with surrounding inflammation and presacral edema. There is   a large left inguinal hernia containing nondilated sigmoid colon. There is no bowel obstruction, pneumatosis or free air. The appendix, small bowel and stomach are within normal limits. Vascular/Lymphatic: Aortic atherosclerosis. No enlarged abdominal or pelvic lymph nodes. Reproductive: Prostate is unremarkable. Other: There is no ascites. There is a small fat containing umbilical hernia. Musculoskeletal: No acute or significant osseous findings. IMPRESSION: 1. Findings compatible with stercoral colitis/proctitis. 2. Large left inguinal hernia containing nondilated sigmoid colon. 3. Marked distention of the bladder. 4. Indeterminate right adrenal nodules measuring 1 cm. Recommend nonemergent follow-up with adrenal CT or MRI. Aortic Atherosclerosis (ICD10-I70.0).  Electronically Signed   By: Amy  Guttmann M.D.   On: 02/17/2023 21:02    EKG: Independently reviewed. Sinus tachycardia, rate 112.   Assessment/Plan   1. Stercoral colitis  - No perforation  - Plan for disimpaction and enema followed by MiraLax    2. Urinary retention  - In and out cath as needed, check UA, continue Flomax   3. Hypokalemia  - Serum potassium is 2.4 on admission  - Treated with IV and oral potassium in ED  - Continue potassium replacement, check magnesium level, repeat chemistries in the am  4. Alcoholism  - Pt's family reported that he drinks heavily  - Monitor with CIWA, use Ativan if needed, supplement vitamins    5. Inguinal hernia  - Noted on CT in ED  - Does not appear to be an acute issue, outpatient follow-up recommended after discharge     6. Right adrenal nodule  - Noted incidentally on CT in ED  - Outpatient follow-up recommend     DVT prophylaxis: Lovenox  Code Status: Full  Level of Care: Level of care: Telemetry Medical Family Communication: Daughter updated by phone   Disposition Plan:  Patient is from: home  Anticipated d/c is to: TBD Anticipated d/c date is: 5/16 or 02/19/23 Patient currently: Pending management of fecal impaction and correction of electrolytes  Consults called: none  Admission status: Observation     Marcellus Pulliam S Chantia Amalfitano, MD [facility]  02/17/2023, 9:40 PM    

## 2023-02-17 NOTE — ED Triage Notes (Signed)
Pt arrives via [facility] from home for lower abdominal pain that started earlier today. Pt had home health nurse at home at that time and pt was reportedly diaphoretic and tachycardic. Pt felt nauseas but did not throw up. Pt now states that pain has resolved.   Of note, pt is legally blind.

## 2023-02-17 NOTE — ED Provider Notes (Addendum)
[facility] AT [facility] Provider Note   CSN: [id number] Arrival date & time: 02/17/23  1714     History  Chief Complaint  Patient presents with   Abdominal Pain    Craig Jackson is a 72 y.o. male.  Patient c/o episode lower abd pain earlier. States had eaten blueberries and bowl of ice cream and then had onset lower abdominal cramping that lasted ~ 30 minutes. Patient indicates pain currently resolved. No nausea/vomiting. Indicates has been having regular bms. Denies dysuria or trouble emptying bladder. No scrotal or testicular pain or swelling. Denies back or flank pain. No fever or chills.   The history is provided by the patient, medical records and the EMS personnel.  Abdominal Pain Associated symptoms: no chest pain, no chills, no constipation, no cough, no diarrhea, no dysuria, no fever, no nausea, no shortness of breath, no sore throat and no vomiting        Home Medications Prior to Admission medications   Medication Sig Start Date End Date Taking? Authorizing Provider  CHONDROITIN SULFATE A PO Take 1,600 mg by mouth daily.    [provider]  omeprazole (PRILOSEC) 20 MG capsule TAKE 1 CAPSULE BY MOUTH EVERY DAY 08/10/22   Karie Schwalbe, MD  OVER THE COUNTER MEDICATION Super Prostate 2 tablets daily    [provider]  tadalafil (CIALIS) 5 MG tablet TAKE ONE TABLET BY MOUTH ONCE DAILY 01/06/23   Karie Schwalbe, MD  tamsulosin (FLOMAX) 0.4 MG CAPS capsule TAKE 1 CAPSULE BY MOUTH TWICE A DAY 02/23/22   Karie Schwalbe, MD      Allergies    Patient has no known allergies.    Review of Systems   Review of Systems  Constitutional:  Negative for chills and fever.  HENT:  Negative for sore throat.   Respiratory:  Negative for cough and shortness of breath.   Cardiovascular:  Negative for chest pain.  Gastrointestinal:  Positive for abdominal pain. Negative for constipation, diarrhea, nausea and vomiting.   Genitourinary:  Negative for dysuria, flank pain, scrotal swelling and testicular pain.  Musculoskeletal:  Negative for back pain.  Skin:  Negative for rash.  Neurological:  Negative for headaches.    Physical Exam Updated Vital Signs BP 137/78   Pulse (!) 102   Temp 98.9 F (37.2 C) (Oral)   Resp 19   SpO2 100%  Physical Exam Vitals and nursing note reviewed.  Constitutional:      Appearance: Normal appearance. He is well-developed.  HENT:     Head: Atraumatic.     Nose: Nose normal.     Mouth/Throat:     Mouth: Mucous membranes are moist.     Pharynx: Oropharynx is clear.  Eyes:     General: No scleral icterus.    Conjunctiva/sclera: Conjunctivae normal.  Neck:     Trachea: No tracheal deviation.  Cardiovascular:     Rate and Rhythm: Normal rate and regular rhythm.     Pulses: Normal pulses.     Heart sounds: Normal heart sounds. No murmur heard.    No friction rub. No gallop.  Pulmonary:     Effort: Pulmonary effort is normal. No accessory muscle usage or respiratory distress.     Breath sounds: Normal breath sounds.  Abdominal:     General: Bowel sounds are normal. There is no distension.     Palpations: Abdomen is soft. There is no mass.     Tenderness: There is  no abdominal tenderness. There is no guarding or rebound.     Comments: Left inguinal hernia, non tender, not painful.   Genitourinary:    Comments: No cva tenderness. Musculoskeletal:        General: No swelling or tenderness.     Cervical back: Normal range of motion and neck supple. No rigidity.  Skin:    General: Skin is warm and dry.     Findings: No rash.  Neurological:     Mental Status: He is alert.     Comments: Alert, speech clear.   Psychiatric:        Mood and Affect: Mood normal.     ED Results / Procedures / Treatments   Labs (all labs ordered are listed, but only abnormal results are displayed) Results for orders placed or performed during the hospital encounter of 02/17/23   CBC with Differential  Result Value Ref Range   WBC 15.0 (H) 4.0 - 10.5 K/uL   RBC 4.79 4.22 - 5.81 MIL/uL   Hemoglobin 14.4 13.0 - 17.0 g/dL   HCT 65.7 84.6 - 96.2 %   MCV 87.7 80.0 - 100.0 fL   MCH 30.1 26.0 - 34.0 pg   MCHC 34.3 30.0 - 36.0 g/dL   RDW 95.2 84.1 - 32.4 %   Platelets 385 150 - 400 K/uL   nRBC 0.0 0.0 - 0.2 %   Neutrophils Relative % 76 %   Neutro Abs 11.4 (H) 1.7 - 7.7 K/uL   Lymphocytes Relative 14 %   Lymphs Abs 2.1 0.7 - 4.0 K/uL   Monocytes Relative 8 %   Monocytes Absolute 1.2 (H) 0.1 - 1.0 K/uL   Eosinophils Relative 1 %   Eosinophils Absolute 0.1 0.0 - 0.5 K/uL   Basophils Relative 0 %   Basophils Absolute 0.1 0.0 - 0.1 K/uL   Immature Granulocytes 1 %   Abs Immature Granulocytes 0.10 (H) 0.00 - 0.07 K/uL  Comprehensive metabolic panel  Result Value Ref Range   Sodium 136 135 - 145 mmol/L   Potassium 2.4 (LL) 3.5 - 5.1 mmol/L   Chloride 97 (L) 98 - 111 mmol/L   CO2 25 22 - 32 mmol/L   Glucose, Bld 132 (H) 70 - 99 mg/dL   BUN 11 8 - 23 mg/dL   Creatinine, Ser 4.01 0.61 - 1.24 mg/dL   Calcium 9.0 8.9 - 02.7 mg/dL   Total Protein 7.2 6.5 - 8.1 g/dL   Albumin 3.8 3.5 - 5.0 g/dL   AST 21 15 - 41 U/L   ALT 18 0 - 44 U/L   Alkaline Phosphatase 74 38 - 126 U/L   Total Bilirubin 0.7 0.3 - 1.2 mg/dL   GFR, Estimated >25 >36 mL/min   Anion gap 14 5 - 15  Lipase, blood  Result Value Ref Range   Lipase 25 11 - 51 U/L    EKG EKG Interpretation  Date/Time:  Wednesday Feb 17 2023 17:28:19 EDT Ventricular Rate:  112 PR Interval:  162 QRS Duration: 90 QT Interval:  338 QTC Calculation: 461 R Axis:   -89 Text Interpretation: Sinus tachycardia Non-specific ST-t changes No previous ECGs available Confirmed by Cathren Laine ([id number]) on 02/17/2023 6:52:26 PM  Radiology CT Renal Stone Study  Result Date: 02/17/2023 CLINICAL DATA:  Abdominal and flank pain EXAM: CT ABDOMEN AND PELVIS WITHOUT CONTRAST TECHNIQUE: Multidetector CT imaging of the abdomen and  pelvis was performed following the standard protocol without IV contrast. RADIATION DOSE REDUCTION: This exam was  performed according to the departmental dose-optimization program which includes automated exposure control, adjustment of the mA and/or kV according to patient size and/or use of iterative reconstruction technique. COMPARISON:  None Available. FINDINGS: Lower chest: No acute abnormality. Hepatobiliary: No focal liver abnormality is seen. No gallstones, gallbladder wall thickening, or biliary dilatation. Pancreas: There is diffuse fatty infiltration of the pancreas. No acute abnormality. Spleen: Normal in size without focal abnormality. Adrenals/Urinary Tract: There are 2 small right adrenal nodules which are indeterminate. These measure 1 cm. The left adrenal gland bilateral kidneys appear within normal limits. Bladder is markedly distended. Stomach/Bowel: Rectum is dilated and stool-filled. There is rectal wall thickening with surrounding inflammation and presacral edema. There is a large left inguinal hernia containing nondilated sigmoid colon. There is no bowel obstruction, pneumatosis or free air. The appendix, small bowel and stomach are within normal limits. Vascular/Lymphatic: Aortic atherosclerosis. No enlarged abdominal or pelvic lymph nodes. Reproductive: Prostate is unremarkable. Other: There is no ascites. There is a small fat containing umbilical hernia. Musculoskeletal: No acute or significant osseous findings. IMPRESSION: 1. Findings compatible with stercoral colitis/proctitis. 2. Large left inguinal hernia containing nondilated sigmoid colon. 3. Marked distention of the bladder. 4. Indeterminate right adrenal nodules measuring 1 cm. Recommend nonemergent follow-up with adrenal CT or MRI. Aortic Atherosclerosis (ICD10-I70.0). Electronically Signed   By: Darliss Cheney M.D.   On: 02/17/2023 21:02    Procedures Procedures    Medications Ordered in ED Medications  potassium chloride  10 mEq in 100 mL IVPB (10 mEq Intravenous New Bag/Given 02/17/23 2007)  potassium chloride SA (KLOR-CON M) CR tablet 40 mEq (has no administration in time range)    ED Course/ Medical Decision Making/ A&P                             Medical Decision Making Problems Addressed: Bladder distension: acute illness or injury with systemic symptoms Hypokalemia: acute illness or injury with systemic symptoms that poses a threat to life or bodily functions Left inguinal hernia: chronic illness or injury with exacerbation, progression, or side effects of treatment Lower abdominal pain: acute illness or injury with systemic symptoms that poses a threat to life or bodily functions Stercoral colitis: acute illness or injury with systemic symptoms that poses a threat to life or bodily functions  Amount and/or Complexity of Data Reviewed Independent Historian: EMS    Details: hx External Data Reviewed: notes. Labs: ordered. Decision-making details documented in ED Course. Radiology: ordered and independent interpretation performed. Decision-making details documented in ED Course. ECG/medicine tests: ordered and independent interpretation performed. Decision-making details documented in ED Course. Discussion of management or test interpretation with external provider(s): hospitalists  Risk Prescription drug management. Decision regarding hospitalization.   Iv ns. Continuous pulse ox and cardiac monitoring. Labs ordered/sent. Imaging ordered.   Differential diagnosis includes diverticulitis, ureteral stone, etc. Dispo decision including potential need for admission considered - will get labs and imaging and reassess.   Reviewed nursing notes and prior charts for additional history. External reports reviewed. Additional history from: EMS.   Cardiac monitor: sinus rhythm, rate 90.  Labs reviewed/interpreted by me - wbc mildly high. K low. Mg added to labs. Kcl iv and po. Ecg.   CT  reviewed/interpreted by me - bladder distension. Will place foley, check pvr. Stercoral colitis. Left inguinal hernia - pt and family indicate left inguinal hernia and appearance of scrotum is chronic, not acutely changed, pt denies pain to  area. Rectal, large amount soft formed stool - subsequent bm in ED.   Given v low k, stercoral colitis, bladder distension, etc. Will admit.   Hospitalists consulted for admission. Discussed pt - will admit.   CRITICAL CARE RE: severe hypokalemia requiring parenteral replacement, stercoral colitis, bladder distension, Performed by: Suzi Roots Total critical care time: 45 minutes Critical care time was exclusive of separately billable procedures and treating other patients. Critical care was necessary to treat or prevent imminent or life-threatening deterioration. Critical care was time spent personally by me on the following activities: development of treatment plan with patient and/or surrogate as well as nursing, discussions with consultants, evaluation of patient's response to treatment, examination of patient, obtaining history from patient or surrogate, ordering and performing treatments and interventions, ordering and review of laboratory studies, ordering and review of radiographic studies, pulse oximetry and re-evaluation of patient's condition.              Final Clinical Impression(s) / ED Diagnoses Final diagnoses:  Lower abdominal pain  Bladder distension  Stercoral colitis  Hypokalemia  Left inguinal hernia    Rx / DC Orders ED Discharge Orders     None          Cathren Laine, MD 02/17/23 2137

## 2023-02-17 NOTE — ED Notes (Signed)
ED TO INPATIENT HANDOFF REPORT  ED Nurse Name and Phone #: [phone]  S Name/Age/Gender Craig Jackson 72 y.o. male Room/Bed: 008C/008C  Code Status   Code Status: Full Code  Home/SNF/Other Home Patient oriented to: self, place, time, and situation Is this baseline? 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 Pt had home health nurse at home at that time and pt was reportedly diaphoretic and tachycardic. Pt felt nauseas but did not throw up. Pt now states that pain has resolved.   Of note, pt is legally blind.    Allergies No Known Allergies  Level of Care/Admitting Diagnosis ED Disposition     ED Disposition  Admit   Condition  --   Comment  Hospital Area: [facility] [[id number]]  Level of Care: Telemetry Medical [104]  May place patient in observation at [facility] or [facility] if equivalent level of care is available:: Yes  Covid Evaluation: Asymptomatic - no recent exposure (last 10 days) testing not required  Diagnosis: Stercoral colitis [9604540]  Admitting Physician: Briscoe Deutscher [[id number]]  Attending Physician: Briscoe Deutscher [[id number]]          B Medical/Surgery History Past Medical History:  Diagnosis Date   Arthritis    BPH with obstruction/lower urinary tract symptoms    Elevated PSA    Generalized osteoarthritis of multiple sites    mostly hands   GERD (gastroesophageal reflux disease)    Gout    Past Surgical History:  Procedure Laterality Date   APPENDECTOMY     CATARACT EXTRACTION     EYE SURGERY  2016   detached retina   ROTATOR CUFF REPAIR     SHOULDER SURGERY Left 1972   separation     A IV Location/Drains/Wounds Patient Lines/Drains/Airways Status     Active Line/Drains/Airways     Name Placement date Placement time Site Days   Peripheral IV 02/17/23 20 G Left  Antecubital 02/17/23  1947  Antecubital  less than 1            Intake/Output Last 24 hours No intake or output data in the 24 hours ending 02/17/23 2202  Labs/Imaging Results for orders placed or performed during the hospital encounter of 02/17/23 (from the past 48 hour(s))  CBC with Differential     Status: Abnormal   Collection Time: 02/17/23  6:05 PM  Result Value Ref Range   WBC 15.0 (H) 4.0 - 10.5 K/uL   RBC 4.79 4.22 - 5.81 MIL/uL   Hemoglobin 14.4 13.0 - 17.0 g/dL   HCT 13.0 86.5 - 78.4 %   MCV 87.7 80.0 - 100.0 fL   MCH 30.1 26.0 - 34.0 pg   MCHC 34.3 30.0 - 36.0 g/dL   RDW 69.6 29.5 - 28.4 %   Platelets 385 150 - 400 K/uL   nRBC 0.0 0.0 - 0.2 %   Neutrophils Relative % 76 %   Neutro Abs 11.4 (H) 1.7 - 7.7 K/uL   Lymphocytes Relative 14 %   Lymphs Abs 2.1 0.7 - 4.0 K/uL   Monocytes Relative 8 %   Monocytes Absolute 1.2 (H) 0.1 - 1.0 K/uL   Eosinophils Relative 1 %   Eosinophils Absolute 0.1 0.0 - 0.5 K/uL   Basophils Relative 0 %   Basophils Absolute 0.1 0.0 - 0.1 K/uL   Immature Granulocytes 1 %  Abs Immature Granulocytes 0.10 (H) 0.00 - 0.07 K/uL    Comment: Performed at [facility], [address], [place], [place] [address]  Comprehensive metabolic panel     Status: Abnormal   Collection Time: 02/17/23  6:05 PM  Result Value Ref Range   Sodium 136 135 - 145 mmol/L   Potassium 2.4 (LL) 3.5 - 5.1 mmol/L    Comment: CRITICAL RESULT CALLED TO, READ BACK BY AND VERIFIED WITH A TOWSELEY RN 02/17/2023 1905 BNUNNERY   Chloride 97 (L) 98 - 111 mmol/L   CO2 25 22 - 32 mmol/L   Glucose, Bld 132 (H) 70 - 99 mg/dL    Comment: Glucose reference range applies only to samples taken after fasting for at least 8 hours.   BUN 11 8 - 23 mg/dL   Creatinine, Ser 6.04 0.61 - 1.24 mg/dL   Calcium 9.0 8.9 - 54.0 mg/dL   Total Protein 7.2 6.5 - 8.1 g/dL   Albumin 3.8 3.5 - 5.0 g/dL   AST 21 15 - 41 U/L   ALT 18 0 - 44 U/L   Alkaline Phosphatase 74 38 - 126 U/L    Total Bilirubin 0.7 0.3 - 1.2 mg/dL   GFR, Estimated >98 >11 mL/min    Comment: (NOTE) Calculated using the CKD-EPI Creatinine Equation (2021)    Anion gap 14 5 - 15    Comment: Performed at [facility], [address] [place], [place] [address]  Lipase, blood     Status: None   Collection Time: 02/17/23  6:05 PM  Result Value Ref Range   Lipase 25 11 - 51 U/L    Comment: Performed at [facility], [address], [place], [place] [address]   CT Renal Stone Study  Result Date: 02/17/2023 CLINICAL DATA:  Abdominal and flank pain EXAM: CT ABDOMEN AND PELVIS WITHOUT CONTRAST TECHNIQUE: Multidetector CT imaging of the abdomen and pelvis was performed following the standard protocol without IV contrast. RADIATION DOSE REDUCTION: This exam was performed according to the departmental dose-optimization program which includes automated exposure control, adjustment of the Craig and/or kV according to patient size and/or use of iterative reconstruction technique. COMPARISON:  None Available. FINDINGS: Lower chest: No acute abnormality. Hepatobiliary: No focal liver abnormality is seen. No gallstones, gallbladder wall thickening, or biliary dilatation. Pancreas: There is diffuse fatty infiltration of the pancreas. No acute abnormality. Spleen: Normal in size without focal abnormality. Adrenals/Urinary Tract: There are 2 small right adrenal nodules which are indeterminate. These measure 1 cm. The left adrenal gland bilateral kidneys appear within normal limits. Bladder is markedly distended. Stomach/Bowel: Rectum is dilated and stool-filled. There is rectal wall thickening with surrounding inflammation and presacral edema. There is a large left inguinal hernia containing nondilated sigmoid colon. There is no bowel obstruction, pneumatosis or free air. The appendix, small bowel and stomach are within normal limits. Vascular/Lymphatic: Aortic atherosclerosis. No enlarged abdominal or pelvic lymph nodes.  Reproductive: Prostate is unremarkable. Other: There is no ascites. There is a small fat containing umbilical hernia. Musculoskeletal: No acute or significant osseous findings. IMPRESSION: 1. Findings compatible with stercoral colitis/proctitis. 2. Large left inguinal hernia containing nondilated sigmoid colon. 3. Marked distention of the bladder. 4. Indeterminate right adrenal nodules measuring 1 cm. Recommend nonemergent follow-up with adrenal CT or MRI. Aortic Atherosclerosis (ICD10-I70.0). Electronically Signed   By: Darliss Cheney M.D.   On: 02/17/2023 21:02    Pending Labs Unresulted Labs (From admission, onward)  Start     Ordered   02/24/23 0500  Creatinine, serum  (enoxaparin (LOVENOX)    CrCl >/= 30 ml/min)  Weekly,   R     Comments: while on enoxaparin therapy    02/17/23 2139   02/18/23 0500  Basic metabolic panel  Daily,   R      02/17/23 2139   02/18/23 0500  Magnesium  Tomorrow morning,   R        02/17/23 2139   02/18/23 0500  CBC  Daily,   R      02/17/23 2139   02/17/23 1910  Magnesium  Once,   STAT        02/17/23 1909   02/17/23 1750  Urinalysis, Routine w reflex microscopic -Urine, Clean Catch  (ED Abdominal Pain)  Once,   URGENT       Question:  Specimen Source  Answer:  Urine, Clean Catch   02/17/23 1749            Vitals/Pain Today's Vitals   02/17/23 1717 02/17/23 1719 02/17/23 1857  BP:  121/74 137/78  Pulse:  (!) 115 (!) 102  Resp:  (!) 22 19  Temp:  98.9 F (37.2 C)   TempSrc:  Oral   SpO2:  100% 100%  PainSc: 0-No pain      Isolation Precautions No active isolations  Medications Medications  potassium chloride 10 mEq in 100 mL IVPB (0 mEq Intravenous Stopped 02/17/23 2156)  tamsulosin (FLOMAX) capsule 0.4 mg (has no administration in time range)  enoxaparin (LOVENOX) injection 40 mg (40 mg Subcutaneous Given 02/17/23 2156)  sodium chloride flush (NS) 0.9 % injection 3 mL (has no administration in time range)  0.9 % NaCl with KCl 20 mEq/ L   infusion (has no administration in time range)  acetaminophen (TYLENOL) tablet 650 mg (has no administration in time range)    Or  acetaminophen (TYLENOL) suppository 650 mg (has no administration in time range)  ondansetron (ZOFRAN) tablet 4 mg (has no administration in time range)    Or  ondansetron (ZOFRAN) injection 4 mg (has no administration in time range)  mineral oil enema 1 enema (has no administration in time range)  polyethylene glycol (MIRALAX / GLYCOLAX) packet 17 g (has no administration in time range)  LORazepam (ATIVAN) tablet 1-4 mg (has no administration in time range)    Or  LORazepam (ATIVAN) injection 1-4 mg (has no administration in time range)  thiamine (VITAMIN B1) tablet 100 mg (100 mg Oral Given 02/17/23 2156)    Or  thiamine (VITAMIN B1) injection 100 mg ( Intravenous See Alternative 02/17/23 2156)  folic acid (FOLVITE) tablet 1 mg (1 mg Oral Given 02/17/23 2156)  multivitamin with minerals tablet 1 tablet (1 tablet Oral Given 02/17/23 2156)  LORazepam (ATIVAN) tablet 0-4 mg (has no administration in time range)    Followed by  LORazepam (ATIVAN) tablet 0-4 mg (has no administration in time range)  potassium chloride SA (KLOR-CON M) CR tablet 40 mEq (40 mEq Oral Given 02/17/23 2156)    Mobility non-ambulatory     Focused Assessments   R Recommendations: See Admitting Provider Note  Report given to:   Additional Notes:

## 2023-02-18 DIAGNOSIS — F1020 Alcohol dependence, uncomplicated: Secondary | ICD-10-CM | POA: Diagnosis not present

## 2023-02-18 DIAGNOSIS — R338 Other retention of urine: Secondary | ICD-10-CM | POA: Diagnosis not present

## 2023-02-18 DIAGNOSIS — E876 Hypokalemia: Secondary | ICD-10-CM | POA: Diagnosis not present

## 2023-02-18 DIAGNOSIS — K5289 Other specified noninfective gastroenteritis and colitis: Secondary | ICD-10-CM | POA: Diagnosis not present

## 2023-02-18 DIAGNOSIS — Z818 Family history of other mental and behavioral disorders: Secondary | ICD-10-CM | POA: Diagnosis not present

## 2023-02-18 DIAGNOSIS — E278 Other specified disorders of adrenal gland: Secondary | ICD-10-CM | POA: Diagnosis present

## 2023-02-18 DIAGNOSIS — Z8261 Family history of arthritis: Secondary | ICD-10-CM | POA: Diagnosis not present

## 2023-02-18 DIAGNOSIS — N401 Enlarged prostate with lower urinary tract symptoms: Secondary | ICD-10-CM | POA: Diagnosis present

## 2023-02-18 DIAGNOSIS — N3289 Other specified disorders of bladder: Secondary | ICD-10-CM | POA: Diagnosis present

## 2023-02-18 DIAGNOSIS — Z79899 Other long term (current) drug therapy: Secondary | ICD-10-CM | POA: Diagnosis not present

## 2023-02-18 DIAGNOSIS — E279 Disorder of adrenal gland, unspecified: Secondary | ICD-10-CM

## 2023-02-18 DIAGNOSIS — R5381 Other malaise: Secondary | ICD-10-CM | POA: Diagnosis present

## 2023-02-18 DIAGNOSIS — R339 Retention of urine, unspecified: Secondary | ICD-10-CM | POA: Diagnosis present

## 2023-02-18 DIAGNOSIS — Z887 Allergy status to serum and vaccine status: Secondary | ICD-10-CM | POA: Diagnosis not present

## 2023-02-18 DIAGNOSIS — I5032 Chronic diastolic (congestive) heart failure: Secondary | ICD-10-CM | POA: Diagnosis present

## 2023-02-18 DIAGNOSIS — H547 Unspecified visual loss: Secondary | ICD-10-CM | POA: Diagnosis present

## 2023-02-18 DIAGNOSIS — K4090 Unilateral inguinal hernia, without obstruction or gangrene, not specified as recurrent: Secondary | ICD-10-CM | POA: Diagnosis present

## 2023-02-18 DIAGNOSIS — K219 Gastro-esophageal reflux disease without esophagitis: Secondary | ICD-10-CM | POA: Diagnosis present

## 2023-02-18 DIAGNOSIS — Z91018 Allergy to other foods: Secondary | ICD-10-CM | POA: Diagnosis not present

## 2023-02-18 DIAGNOSIS — M1990 Unspecified osteoarthritis, unspecified site: Secondary | ICD-10-CM | POA: Diagnosis present

## 2023-02-18 DIAGNOSIS — Z87891 Personal history of nicotine dependence: Secondary | ICD-10-CM | POA: Diagnosis not present

## 2023-02-18 LAB — BASIC METABOLIC PANEL
Anion gap: 16 — ABNORMAL HIGH (ref 5–15)
Anion gap: 13 (ref 5–15)
BUN: 10 mg/dL (ref 8–23)
BUN: 9 mg/dL (ref 8–23)
CO2: 23 mmol/L (ref 22–32)
CO2: 23 mmol/L (ref 22–32)
Calcium: 8.9 mg/dL (ref 8.9–10.3)
Calcium: 8.5 mg/dL — ABNORMAL LOW (ref 8.9–10.3)
Chloride: 98 mmol/L (ref 98–111)
Chloride: 103 mmol/L (ref 98–111)
Creatinine, Ser: 0.99 mg/dL (ref 0.61–1.24)
Creatinine, Ser: 0.77 mg/dL (ref 0.61–1.24)
GFR, Estimated: >60 mL/min (ref >60)
GFR, Estimated: >60 mL/min (ref >60)
Glucose, Bld: 131 mg/dL — ABNORMAL HIGH (ref 70–99)
Glucose, Bld: 118 mg/dL — ABNORMAL HIGH (ref 70–99)
Potassium: 2.6 mmol/L — CL (ref 3.5–5.1)
Potassium: 3.2 mmol/L — ABNORMAL LOW (ref 3.5–5.1)
Sodium: 137 mmol/L (ref 135–145)
Sodium: 139 mmol/L (ref 135–145)

## 2023-02-18 LAB — URINALYSIS, ROUTINE W REFLEX MICROSCOPIC
Bacteria, UA: NONE SEEN
Bilirubin Urine: NEGATIVE
Glucose, UA: NEGATIVE mg/dL
Hgb urine dipstick: NEGATIVE
Ketones, ur: NEGATIVE mg/dL
Leukocytes,Ua: NEGATIVE
Nitrite: NEGATIVE
Protein, ur: NEGATIVE mg/dL
Specific Gravity, Urine: 1.01 (ref 1.005–1.030)
pH: 6 (ref 5.0–8.0)

## 2023-02-18 LAB — CBC
HCT: 38.5 % — ABNORMAL LOW (ref 39.0–52.0)
Hemoglobin: 13.1 g/dL (ref 13.0–17.0)
MCH: 29.5 pg (ref 26.0–34.0)
MCHC: 34 g/dL (ref 30.0–36.0)
MCV: 86.7 fL (ref 80.0–100.0)
Platelets: 324 10*3/uL (ref 150–400)
RBC: 4.44 MIL/uL (ref 4.22–5.81)
RDW: 12.7 % (ref 11.5–15.5)
WBC: 12.7 10*3/uL — ABNORMAL HIGH (ref 4.0–10.5)
nRBC: 0 % (ref 0.0–0.2)

## 2023-02-18 LAB — MAGNESIUM: Magnesium: 1.5 mg/dL — ABNORMAL LOW (ref 1.7–2.4)

## 2023-02-18 MED ORDER — SODIUM CHLORIDE 0.9 % IV SOLN: INTRAVENOUS | Status: DC

## 2023-02-18 MED ORDER — MAGNESIUM SULFATE 2 GM/50ML IV SOLN: 2.0000 g | Freq: Once | INTRAVENOUS | Status: DC

## 2023-02-18 MED ORDER — MAGNESIUM SULFATE 4 GM/100ML IV SOLN
4.0000 g | Freq: Once | INTRAVENOUS | Status: AC
  Administered 2023-02-18: 4 g via INTRAVENOUS
  Filled 2023-02-18: qty 100

## 2023-02-18 MED ORDER — POTASSIUM CHLORIDE CRYS ER 20 MEQ PO TBCR
40.0000 meq | EXTENDED_RELEASE_TABLET | Freq: Once | ORAL | Status: AC
  Administered 2023-02-18: 40 meq via ORAL
  Filled 2023-02-18: qty 2

## 2023-02-18 MED ORDER — POTASSIUM CHLORIDE 10 MEQ/100ML IV SOLN
10.0000 meq | INTRAVENOUS | Status: AC
  Administered 2023-02-18 (×4): 10 meq via INTRAVENOUS
  Filled 2023-02-18 (×4): qty 100

## 2023-02-18 NOTE — TOC CAGE-AID Note (Addendum)
Transition of Care (TOC) - CAGE-AID Screening  Consult for substance use.   Patient states he only drinks alcohol on occasion and it's been months since he has had any alcohol , therefore does not need resources  Patient Details  Name: Jeremiah Foster MRN: [id number] Date of Birth: 1951/03/02  Transition of [facility] CM/SW Contact:    Kingsley Plan, RN Phone Number: 02/18/2023, 11:16 AM   Clinical Narrative:    CAGE-AID Screening:    Have You Ever Felt You Ought to Cut Down on Your Drinking or Drug Use?: No Have People Annoyed You By [employer] Your Drinking Or Drug Use?: No Have You Felt Bad Or Guilty About Your Drinking Or Drug Use?: No Have You Ever Had a Drink or Used Drugs First Thing In The Morning to Steady Your Nerves or to Get Rid of a Hangover?: No CAGE-AID Score: 0  Substance Abuse Education Offered: Yes

## 2023-02-18 NOTE — TOC Initial Note (Addendum)
Transition of Care (TOC) - Initial/Assessment Note   Spoke to patient at bedside.   PCP is with [facility] patient unsure of name .   Address in EPIC is his address, however he is currently staying with a friend Jeremiah Foster who he pays to help take care of him. Jeremiah Foster's address is : [address] , [place] , [place]   Patient does have home health but unsure of name of agency   Patient consented for NCM to call Jeremiah Foster to find out PCP name and home health agency , however he asked NCM to call later in the afternoon because Jeremiah Foster likes to sleep in on her days off   Received a call from [place] with [facility] . He is active with them for [facility] and HHPT , orders entered  Patient Details  Name: Jeremiah Foster MRN: [id number] Date of Birth: 30-May-1951  Transition of Care [facility]) CM/SW Contact:    Kingsley Plan, RN Phone Number: 02/18/2023, 11:33 AM  Clinical Narrative:                         Patient Goals and CMS Choice Patient states their goals for this hospitalization and ongoing recovery are:: to return to home          Expected Discharge Plan and Services                                              Prior Living Arrangements/Services   Lives with:: Self Patient language and need for interpreter reviewed:: Yes Do you feel safe going back to the place where you live?: Yes      Need for Family Participation in Patient Care: Yes (Comment) Care giver support system in place?: Yes (comment)   Criminal Activity/Legal Involvement Pertinent to Current Situation/Hospitalization: No - Comment as needed  Activities of Daily Living      Permission Sought/Granted   Permission granted to share information with : Yes, Verbal Permission Granted  Share Information with NAME: Jeremiah Foster [place] [phone]           Emotional Assessment Appearance:: Appears stated age Attitude/Demeanor/Rapport: Engaged Affect (typically observed):  Accepting Orientation: : Oriented to Self, Oriented to Place, Oriented to  Time, Oriented to Situation Alcohol / Substance Use: Not Applicable Psych Involvement: No (comment)  Admission diagnosis:  Hypokalemia [E87.6] Bladder distension [N32.89] Left inguinal hernia [K40.90] Lower abdominal pain [R10.30] 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] Patient Active Problem List   Diagnosis Date Noted   Hypomagnesemia 02/18/2023   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 [place] [address] Phone: [phone] Fax: [phone]  [facility] [place], [place] - [address] [place] [address] Phone: [phone] Fax: [phone]  [facility] [id number] [place], [place] - [address] [facility] [facility] [address] [place]  [facility] [address] [place] [address] Phone: [phone] Fax: [phone]     Social Determinants of Health (SDOH) Social History: SDOH Screenings   Depression (PHQ2-9): Low Risk  (11/03/2022)  Tobacco Use: Medium Risk (02/17/2023)   SDOH Interventions:     Readmission Risk Interventions     No data to display

## 2023-02-18 NOTE — TOC Initial Note (Incomplete Revision)
Transition of Care (TOC) - Initial/Assessment Note   Spoke to patient at bedside.   PCP is with [facility] patient unsure of name .   Address in EPIC is his address, however he is currently staying with a friend Mariel Sleet who he pays to help take care of him. Tina's address is : [address] , [place] , [place]   Patient does have home health but unsure of name of agency   Patient consented for NCM to call Inetta Fermo to find out PCP name and home health agency , however he asked NCM to call later in the afternoon because Inetta Fermo likes to sleep in on her days off   Received a call from [place] with [facility] . He is active with them for [facility] and HHPT , orders entered  Patient Details  Name: Jeremiah Foster MRN: [id number] Date of Birth: 30-May-1951  Transition of Care [facility]) CM/SW Contact:    Kingsley Plan, RN Phone Number: 02/18/2023, 11:33 AM  Clinical Narrative:                         Patient Goals and CMS Choice Patient states their goals for this hospitalization and ongoing recovery are:: to return to home          Expected Discharge Plan and Services                                              Prior Living Arrangements/Services   Lives with:: Self Patient language and need for interpreter reviewed:: Yes Do you feel safe going back to the place where you live?: Yes      Need for Family Participation in Patient Care: Yes (Comment) Care giver support system in place?: Yes (comment)   Criminal Activity/Legal Involvement Pertinent to Current Situation/Hospitalization: No - Comment as needed  Activities of Daily Living      Permission Sought/Granted   Permission granted to share information with : Yes, Verbal Permission Granted  Share Information with NAME: Inetta Fermo [place] [phone]           Emotional Assessment Appearance:: Appears stated age Attitude/Demeanor/Rapport: Engaged Affect (typically observed):  Accepting Orientation: : Oriented to Self, Oriented to Place, Oriented to  Time, Oriented to Situation Alcohol / Substance Use: Not Applicable Psych Involvement: No (comment)  Admission diagnosis:  Hypokalemia [E87.6] Bladder distension [N32.89] Left inguinal hernia [K40.90] Lower abdominal pain [R10.30] 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] Patient Active Problem List   Diagnosis Date Noted   Hypomagnesemia 02/18/2023   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 [place] [address] Phone: [phone] Fax: [phone]  [facility] - [place], [place] - [address] [place] [address] Phone: [phone] Fax: [phone]  [facility] [id number] [place], [place] - [address] [place]  [facility] [address] [place] [address] Phone: [phone] Fax: [phone]     Social Determinants of Health (SDOH) Social History: SDOH Screenings   Depression (PHQ2-9): Low Risk  (11/03/2022)  Tobacco Use: Medium Risk (02/17/2023)   SDOH Interventions:     Readmission Risk Interventions     No data to display

## 2023-02-18 NOTE — Progress Notes (Addendum)
[facility]-EMERGENCY DEPT Provider Note   CSN: [id number] Arrival date & time: 09/13/22  1044     History  Chief Complaint  Patient presents with   Shortness of Breath    Jeremiah Foster is a 72 y.o. male with a past medical history of seizure, throat cancer, skin cancer brought into the emergency department by [facility] for evaluation of shortness of breath.  Patient states that he has had worsening shortness of breath in the last 3 to 4 days.  Reports history of throat cancer that he uses suction at home to remove mucus in his throat.  States in the last few days he has not been able to suction the mucus which increases shortness of breath.  States he is undergoing immunotherapy for throat cancer once every 3 weeks, last treatment was on Wednesday.  Denies fever, chest pain, headache, dizziness, bowel changes, urinary symptoms.   Shortness of Breath     Past Medical History:  Diagnosis Date   Seizures (HCC)    Past Surgical History:  Procedure Laterality Date   APPENDECTOMY     RESECTION DISTAL CLAVICAL       Home Medications Prior to Admission medications   Medication Sig Start Date End Date Taking? Authorizing Provider  ACCU-CHEK AVIVA PLUS test strip USE TO CHECK BLOOD GLUCOSE BID 01/30/19   [provider]  Accu-Chek Softclix Lancets lancets USE TO CHECK BLOOD GLUCOSE BID AS DIRECTED 01/30/19   [provider]  Blood Glucose Monitoring Suppl (ACCU-CHEK AVIVA PLUS) w/Device KIT CHECK BLOOD GLUCOSE BID 01/30/19   [provider]  Cholecalciferol (D3-1000 PO) Take 2,000 mg by mouth daily.     [provider]  DEPAKOTE SPRINKLES 125 MG capsule Take 4 capsules in AM, 8 capsules at night 07/08/22   Aquino, Karen M, MD  levETIRAcetam (KEPPRA) 1000 MG tablet TAKE 1 TABLET BY MOUTH TWICE DAILY 11/05/21   Aquino, Karen M, MD  lovastatin (MEVACOR) 10 MG tablet TK 1 T PO QD 11/11/17   [provider]  metFORMIN (GLUCOPHAGE)  500 MG tablet Take 500 mg by mouth 2 (two) times daily. 08/12/21   [provider]      Allergies    Patient has no known allergies.    Review of Systems   Review of Systems  Respiratory:  Positive for shortness of breath.     Physical Exam Updated Vital Signs BP 133/68   Pulse 86   Temp 97.7 F (36.5 C) (Oral)   Resp 16   Ht 5' 9" (1.753 m)   Wt 56.7 kg   SpO2 98%   BMI 18.46 kg/m  Physical Exam  ED Results / Procedures / Treatments   Labs (all labs ordered are listed, but only abnormal results are displayed) Labs Reviewed  RESP PANEL BY RT-PCR (RSV, FLU A&B, COVID)  [id number]  URINALYSIS, ROUTINE W REFLEX MICROSCOPIC  BASIC METABOLIC PANEL  CBC WITH DIFFERENTIAL/PLATELET    EKG EKG Interpretation  Date/Time:  Sunday September 13 2022 10:55:19 EST Ventricular Rate:  87 PR Interval:  150 QRS Duration: 86 QT Interval:  387 QTC Calculation: 466 R Axis:   -75 Text Interpretation: Sinus rhythm Left anterior fascicular block Anteroseptal infarct, old Confirmed by Countryman, Chase ([id number]) on 09/13/2022 2:45:08 PM  Radiology No results found.  Procedures Procedures    Medications Ordered in ED Medications - No data to display  ED Course/ Medical Decision Making/ A&P Clinical Course as of 09/13/22 1755  Sun Sep 13, 2022  1138 Stable 48 YOM with throat cancer here with SOB. Feels oropharyngeal closure.   [CC]  1444 Patient requested DC [CC]    Clinical Course User Index [CC] Countryman, Chase, MD                           Medical Decision Making Amount and/or Complexity of Data Reviewed Labs: ordered. Radiology: ordered.   This patient presents to the ED for shortness of breath, this involves an extensive number of treatment options, and is a complaint that carries with a high risk of complications and morbidity.  The differential diagnosis includes metastasis, flu, COVID, bronchitis, pneumonia, infectious etiology.  This is not an exhaustive  list.  Comorbidities that complicate the patient evaluation See HPI  Social determinants of health NA  Additional history obtained: External records from outside source obtained and reviewed including: Chart review including previous notes, labs, imaging.  Cardiac monitoring/EKG: The patient was maintained on a cardiac monitor.  I personally reviewed and interpreted the cardiac monitor which showed an underlying rhythm of: Sinus rhythm.  Lab tests: I ordered CBC, BMP, urinalysis, respiratory panel.  However due to technical issue with the chart, labs cannot be drawn.  Imaging studies: I ordered x-ray of the chest.  However due to technical issues with the chart, imaging cannot be done.  Problem list/ ED course/ Critical interventions/ Medical management: HPI: See above Vital signs within normal range and stable throughout visit. Laboratory/imaging studies significant for: See above. On physical examination, patient is afebrile and appears in no acute distress.  Patient is here due to increased shortness of breath in the last 3-4 days when he failed to suction the mucus from his throat cancer that he is usually able to.  Due to technical issue with the chart, labs and imaging cannot be performed by nursing.  Patient was placed on 2 L nasal cannula with EMS, satting at 99%.  Patient is weaned off of the oxygen in the ER and he seemed to be doing well, satting at 98% on room air.  Patient and patient family requested to go home since his shortness of breath has improved significantly. They refused to wait for labs and imaging studies to be done when technical issues with charting are fixed. Advised patient to follow-up with primary care for further evaluation and management.  Return to ER if new or worsening symptoms. I have reviewed the patient home medicines and have made adjustments as needed.  Consultations obtained: I requested consultation with Dr. Countryman, and discussed lab and  imaging findings as well as pertinent plan.  He/she agrees with the plan.  Disposition Continued outpatient therapy. Follow-up with PCP  recommended for reevaluation of symptoms. Treatment plan discussed with patient.  Pt acknowledged understanding was agreeable to the plan. Worrisome signs and symptoms were discussed with patient, and patient acknowledged understanding to return to the ED if they noticed these signs and symptoms. Patient was stable upon discharge.   This chart was dictated using voice recognition software.  Despite best efforts to proofread,  errors can occur which can change the documentation meaning.          Final Clinical Impression(s) / ED Diagnoses Final diagnoses:  Shortness of breath    Rx / DC Orders ED Discharge Orders     None         Le, Ken, PA 09/13/22 1755    Le, Ken, PA 09/15/22 1619      Countryman, Chase, MD 09/16/22 1541  

## 2023-02-18 NOTE — Care Management Obs Status (Addendum)
MEDICARE OBSERVATION STATUS NOTIFICATION   Patient Details  Name: Jeremiah Foster MRN: [id number] Date of Birth: 09/08/1951   Medicare Observation Status Notification Given:  Yes    Kingsley Plan, RN 02/18/2023, 11:16 AM

## 2023-02-18 NOTE — Care Management Obs Status (Incomplete Revision)
MEDICARE OBSERVATION STATUS NOTIFICATION   Patient Details  Name: Jeremiah Foster MRN: [id number] Date of Birth: 09/08/1951   Medicare Observation Status Notification Given:  Yes    Kingsley Plan, RN 02/18/2023, 11:16 AM

## 2023-02-18 NOTE — TOC CAGE-AID Note (Incomplete Revision)
Transition of Care (TOC) - CAGE-AID Screening  Consult for substance use.   Patient states he only drinks alcohol on occasion and it's been months since he has had any alcohol , therefore does not need resources  Patient Details  Name: Jeremiah Foster MRN: [id number] Date of Birth: 1951/03/02  Transition of Care [facility]) CM/SW Contact:    Kingsley Plan, RN Phone Number: 02/18/2023, 11:16 AM   Clinical Narrative:    CAGE-AID Screening:    Have You Ever Felt You Ought to Cut Down on Your Drinking or Drug Use?: No Have People Annoyed You By [employer] Your Drinking Or Drug Use?: No Have You Felt Bad Or Guilty About Your Drinking Or Drug Use?: No Have You Ever Had a Drink or Used Drugs First Thing In The Morning to Steady Your Nerves or to Get Rid of a Hangover?: No CAGE-AID Score: 0  Substance Abuse Education Offered: Yes

## 2023-02-18 NOTE — Hospital Course (Addendum)
Craig Jackson is a 72 y.o. male with past medical history of benign prostatic hypertrophy, GERD, osteoarthritis, chronic diastolic congestive heart failure and alcohol abuse presented to hospital with lower abdominal pain.  At home he was noted to be diaphoretic and tachycardic by his home health IV and and had been having small amount of liquidy stool for few weeks.  Patient however drinks 1-2 beers a day.  In the ED patient was tachycardic.  Labs showed hypokalemia with potassium of 2.4 with mild leukocytosis at 15 K.  CT scan of the abdomen pelvis was concerning for stercoral colitis with urinary distention.  Patient was given oral and IV potassium in the ED and was received In-N-Out cath..  Subsequently, was admitted hospital for further evaluation and treatment.   Assessment/Plan     Stercoral colitis  Plan for edema MiraLAX and bowel regimen.  No perforation.  Received mineral oil enema x 1.  On MiraLAX twice a day at this time.    Urinary retention  In-N-Out cath as needed.  Continue Flomax.   Hypokalemia  Initial serum potassium 2.4.  Received IV and oral potassium but despite that with potassium still at 2.6.  Patient will receive 40 mill equivalents p.o. bolus with 40 IV today.  Continue to replenish magnesium.  Hypomagnesemia magnesium level of 1.5 today.  Will receive 4 g of magnesium sulfate today.   Alcoholism  Heavy alcohol ingestion.  Continue CIWA protocol multivitamins.  Closely monitor for withdrawals.   Inguinal hernia  Incidental noted on the CT scan.  No acute issues.   Right adrenal nodule  - Noted incidentally on CT in ED.  Follow-up with outpatient

## 2023-02-18 NOTE — Progress Notes (Signed)
Pt was seen for progression to stand and walk, noting vision and unfamiliar situation are limiting him.  He is requiring dense instructions to use walker appropriately and tends to require fairly continual directional andsafety cues to stay inside walker space.  Follow acutely for goals of PT as are outlined below.  Recommend him to practice steps but pt has declined initially, and will need to be off IV for safety.  02/18/23 1600  PT Visit Information  Last PT Received On 02/18/23  Assistance Needed +1  History of Present Illness 72 yo male with onset of lower abd pain was admitted 5/16, referred to PT.  Has tachycardia with loose stool, low K+, dx stercoral colitis, urinary bladder distention, inguinal hernia, and adrenal nodule.  PMHx:  BPH, GERD, OA, chronic CHF, etoh abuse, gout,  Precautions  Precautions Fall  Precaution Comments low vision  Restrictions  Weight Bearing Restrictions No  Home Living  Family/patient expects to be discharged to: Private residence  Living Arrangements Non-relatives/Friends  Available Help at Discharge Friend(s);Available 24 hours/day  Type of Home House  Home Access Stairs to enter  Entrance Stairs-Number of Steps 14  Entrance Stairs-Rails Can reach both  Home Layout One level  Production designer, theatre/television/film (2 wheels)  Additional Comments home with his friend and can count on complete care  Prior Function  Prior Level of Function  Independent/Modified Independent  Mobility Comments home alone with RW  Communication  Communication No difficulties  Pain Assessment  Pain Assessment Faces  Faces Pain Scale 2  Pain Location abdomen and perineum  Cognition  Arousal/Alertness Awake/alert  Behavior During Therapy Flat affect  Overall Cognitive Status No family/caregiver present to determine baseline cognitive functioning  General Comments unclear if pt is at baseline  Upper Extremity Assessment  Upper Extremity Assessment  Overall WFL for tasks assessed  Lower Extremity Assessment  Lower Extremity Assessment Generalized weakness  Cervical / Trunk Assessment  Cervical / Trunk Assessment Kyphotic  Bed Mobility  Overal bed mobility Needs Assistance  Bed Mobility Supine to Sit;Sit to Supine  Supine to sit Min assist  Sit to supine Min assist  General bed mobility comments requires help for direction and to protect his generalized stiffness and discomfort of limbs  Transfers  Overall transfer level Needs assistance  Equipment used Rolling walker (2 wheels)  Transfers Sit to/from Stand  Sit to Stand Min assist  General transfer comment Pt wants to stand alone but requires help for low vision as pt mainly sees outline of objects  Ambulation/Gait  Ambulation/Gait assistance Min assist  Gait Distance (Feet) 45 Feet  Assistive device Rolling walker (2 wheels)  Gait Pattern/deviations Step-to pattern;Step-through pattern;Decreased stride length;Wide base of support;Drifts right/left  General Gait Details Pt has very poor visualization of room and needs min assist cues and moving of walker to direct as light is not assisting his awareness of environment  Gait velocity reduced  Gait velocity interpretation <1.31 ft/sec, indicative of household ambulator  Pre-gait activities standing balance correction  Balance  Overall balance assessment Needs assistance  Sitting-balance support Feet supported  Sitting balance-Leahy Scale Fair  Standing balance support Bilateral upper extremity supported;During functional activity  Standing balance-Leahy Scale Poor  General Comments  General comments (skin integrity, edema, etc.) Pt is up to walk with mainly issues of visualizing room, requiring hands on assist and frequent tactile and verbal cues  PT - End of Session  Equipment Utilized During Treatment Gait belt  Activity Tolerance  Patient tolerated treatment well;Patient limited by fatigue  Patient left in bed;with call  bell/phone within reach;with bed alarm set  Nurse Communication Mobility status  PT Assessment  PT Recommendation/Assessment Patient needs continued PT services  PT Visit Diagnosis Unsteadiness on feet (R26.81);Muscle weakness (generalized) (M62.81);Difficulty in walking, not elsewhere classified (R26.2)  PT Problem List Decreased strength;Decreased activity tolerance;Decreased balance;Decreased mobility;Decreased safety awareness  PT Plan  PT Frequency (ACUTE ONLY) Min 3X/week  PT Treatment/Interventions (ACUTE ONLY) DME instruction;Gait training;Functional mobility training;Therapeutic activities;Therapeutic exercise;Balance training;Neuromuscular re-education;Patient/family education;Stair training  AM-PAC PT "6 Clicks" Mobility Outcome Measure (Version 2)  Help needed turning from your back to your side while in a flat bed without using bedrails? 3  Help needed moving from lying on your back to sitting on the side of a flat bed without using bedrails? 3  Help needed moving to and from a bed to a chair (including a wheelchair)? 3  Help needed standing up from a chair using your arms (e.g., wheelchair or bedside chair)? 3  Help needed to walk in hospital room? 3  Help needed climbing 3-5 steps with a railing?  1  6 Click Score 16  Consider Recommendation of Discharge To: Home with [facility]  Progressive Mobility  What is the highest level of mobility based on the progressive mobility assessment? Level 4 (Walks with assist in room) - Balance while marching in place and cannot step forward and back - Complete  PT Recommendation  Follow Up Recommendations Home health PT  Assistance recommended at discharge Frequent or constant Supervision/Assistance  Patient can return home with the following A lot of help with walking and/or transfers;A lot of help with bathing/dressing/bathroom;Assistance with cooking/housework;Assist for transportation;Help with stairs or ramp for entrance  Functional Status  Assessment Patient has had a recent decline in their functional status and demonstrates the ability to make significant improvements in function in a reasonable and predictable amount of time.  PT equipment None recommended by PT  Individuals Consulted  Consulted and Agree with Results and Recommendations Patient  Acute Rehab PT Goals  Patient Stated Goal to get home with his friend and then his own home  PT Goal Formulation With patient  Time For Goal Achievement 03/04/23  Potential to Achieve Goals Good  PT Time Calculation  PT Start Time (ACUTE ONLY) 1312  PT Stop Time (ACUTE ONLY) 1345  PT Time Calculation (min) (ACUTE ONLY) 33 min  PT General Charges  $$ ACUTE PT VISIT 1 Visit  PT Evaluation  $PT Eval Moderate Complexity 1 Mod  PT Treatments  $Therapeutic Activity 8-22 mins  Written Expression  Dominant Hand Right    Samul Dada, PT PhD Acute Rehab Dept. Number: [facility] [phone] and [facility] [phone]

## 2023-02-19 DIAGNOSIS — K5289 Other specified noninfective gastroenteritis and colitis: Principal | ICD-10-CM

## 2023-02-19 DIAGNOSIS — R338 Other retention of urine: Secondary | ICD-10-CM

## 2023-02-19 DIAGNOSIS — F1020 Alcohol dependence, uncomplicated: Secondary | ICD-10-CM

## 2023-02-19 DIAGNOSIS — E279 Disorder of adrenal gland, unspecified: Secondary | ICD-10-CM

## 2023-02-19 DIAGNOSIS — E876 Hypokalemia: Secondary | ICD-10-CM

## 2023-02-19 LAB — MAGNESIUM: Magnesium: 2.2 mg/dL (ref 1.7–2.4)

## 2023-02-19 LAB — BASIC METABOLIC PANEL
Anion gap: 8 (ref 5–15)
BUN: 8 mg/dL (ref 8–23)
CO2: 25 mmol/L (ref 22–32)
Calcium: 8.3 mg/dL — ABNORMAL LOW (ref 8.9–10.3)
Chloride: 107 mmol/L (ref 98–111)
Creatinine, Ser: 0.8 mg/dL (ref 0.61–1.24)
GFR, Estimated: >60 mL/min (ref >60)
Glucose, Bld: 109 mg/dL — ABNORMAL HIGH (ref 70–99)
Potassium: 3.1 mmol/L — ABNORMAL LOW (ref 3.5–5.1)
Sodium: 140 mmol/L (ref 135–145)

## 2023-02-19 LAB — CBC
HCT: 37.3 % — ABNORMAL LOW (ref 39.0–52.0)
Hemoglobin: 12.5 g/dL — ABNORMAL LOW (ref 13.0–17.0)
MCH: 30 pg (ref 26.0–34.0)
MCHC: 33.5 g/dL (ref 30.0–36.0)
MCV: 89.7 fL (ref 80.0–100.0)
Platelets: 263 10*3/uL (ref 150–400)
RBC: 4.16 MIL/uL — ABNORMAL LOW (ref 4.22–5.81)
RDW: 13.3 % (ref 11.5–15.5)
WBC: 9 10*3/uL (ref 4.0–10.5)
nRBC: 0 % (ref 0.0–0.2)

## 2023-02-19 MED ORDER — POTASSIUM CHLORIDE CRYS ER 20 MEQ PO TBCR: 40.0000 meq | EXTENDED_RELEASE_TABLET | Freq: Every day | ORAL | 0 refills | Status: DC

## 2023-02-19 MED ORDER — POLYETHYLENE GLYCOL 3350 17 G PO PACK: 17.0000 g | PACK | Freq: Every day | ORAL | 0 refills | Status: DC

## 2023-02-19 MED ORDER — POTASSIUM CHLORIDE CRYS ER 20 MEQ PO TBCR
40.0000 meq | EXTENDED_RELEASE_TABLET | Freq: Two times a day (BID) | ORAL | Status: DC
  Administered 2023-02-19: 40 meq via ORAL
  Filled 2023-02-19: qty 2

## 2023-02-19 MED ORDER — TAMSULOSIN HCL 0.4 MG PO CAPS: 0.4000 mg | ORAL_CAPSULE | Freq: Two times a day (BID) | ORAL | 2 refills | Status: DC

## 2023-02-19 NOTE — TOC Transition Note (Addendum)
Transition of Care [facility]) - CM/SW Discharge Note  Provided update to [facility] with [facility]  Patient Details  Name: Jeremiah Foster MRN: [id number] Date of Birth: 12-Jan-1951  Transition of Care [facility]) CM/SW Contact:  Kingsley Plan, RN Phone Number: 02/19/2023, 11:27 AM   Clinical Narrative:             Patient Goals and CMS Choice      Discharge Placement                         Discharge Plan and Services Additional resources added to the After Visit Summary for                                       Social Determinants of Health (SDOH) Interventions SDOH Screenings   Depression (PHQ2-9): Low Risk  (11/03/2022)  Tobacco Use: Medium Risk (02/17/2023)     Readmission Risk Interventions     No data to display

## 2023-02-19 NOTE — TOC Transition Note (Incomplete Revision)
Transition of Care [facility]) - CM/SW Discharge Note  Provided update to [facility] with [facility]  Patient Details  Name: Grantham Bixler MRN: [id number] Date of Birth: 12-Jan-1951  Transition of Care [facility]) CM/SW Contact:  Kingsley Plan, RN Phone Number: 02/19/2023, 11:27 AM   Clinical Narrative:             Patient Goals and CMS Choice      Discharge Placement                         Discharge Plan and Services Additional resources added to the After Visit Summary for                                       Social Determinants of Health (SDOH) Interventions SDOH Screenings   Depression (PHQ2-9): Low Risk  (11/03/2022)  Tobacco Use: Medium Risk (02/17/2023)     Readmission Risk Interventions     No data to display

## 2023-02-19 NOTE — Discharge Summary (Signed)
 Physician Discharge Summary  Dontavis Harry Axe MRN:[id number] DOB: 10/08/1950 DOA: 02/17/2023  PCP: System, Provider Not In  Admit date: 02/17/2023 Discharge date: 02/19/2023  Admitted From: Home  Discharge disposition: Home  Recommendations for Outpatient Follow-Up:   Follow up with your primary care provider in one week.  Check CBC, BMP, magnesium in the next visit  Discharge Diagnosis:   Principal Problem:   Stercoral colitis Active Problems:   Acute urinary retention   Hypokalemia   Lesion of adrenal gland (HCC)   Alcoholism (HCC)   Hypomagnesemia  Discharge Condition: Improved.  Diet recommendation: Heart healthy Wound care: None.  Code status: Full.   History of Present Illness:   Jeremiah Foster is a 72 y.o. male with past medical history of benign prostatic hypertrophy, GERD, osteoarthritis, chronic diastolic congestive heart failure and alcohol abuse presented to the  hospital with lower abdominal pain.  At home, patient was noted to be diaphoretic and tachycardic by his home health IV and and had been having small amount of liquidy stool for few weeks.  Patient however drinks 1-2 beers a day.  In the ED patient was tachycardic.  Labs showed hypokalemia with potassium of 2.4 with mild leukocytosis at 15 K.  CT scan of the abdomen pelvis was concerning for stercoral colitis with urinary distention.  Patient was given oral and IV potassium in the ED and was received In-N-Out cath..  Subsequently, patient was admitted hospital for further evaluation and treatment.    Hospital Course:   Following conditions were addressed during hospitalization as listed below,  Stercoral colitis  Provide at this time.  Patient did have good bowel movement after enema.  Recommended to take MiraLAX daily at home.  Spoke with the caretaker as well.    Urinary retention  Initially needed In-N-Out cath.  Will put the patient on Flomax.  Patient was not taking at home.  Prescription  given.  Patient able to urinate at this time.  Hypokalemia  Initial serum potassium 2.4.  Received multiple doses of potassium.  Potassium today 3.1.  Patient will continue oral potassium supplement on discharge.   Hypomagnesemia improved after replacement.  Magnesium prior to discharge was 2.2.    Alcoholism  Patient denies recent ingestion.  Was on CIWA protocol but did not have any withdrawal symptoms.     Inguinal hernia  Incidental noted on the CT scan.  No acute issues.    Right adrenal nodule  Noted incidentally on CT in ED.  Follow-up with outpatient     Deconditioning, debility.  PT evaluation commend home health.  Patient states that he does have home health place. Patient lives in his house and somebody helps him out.  Patient uses walker at home.   Disposition.  At this time, patient is stable for disposition home with outpatient PCP follow-up.  Medical Consultants:   None.  Procedures:    None Subjective:   Today, patient was seen and examined at bedside.  Denies any nausea vomiting fever chills or rigor.  Feels overall better.  Has been able to eat good.  Has had bowel movements.  Discharge Exam:   Vitals:   02/19/23 0511 02/19/23 0732  BP: (!) 110/53 (!) 97/59  Pulse: 82 78  Resp: 18 16  Temp:  98.5 F (36.9 C)  SpO2: 100% 99%   Vitals:   02/18/23 2105 02/19/23 0500 02/19/23 0511 02/19/23 0732  BP: (!) 105/57  (!) 110/53 (!) 97/59  Pulse: 91  82 78    Resp: 17  18 16  Temp: 98.8 F (37.1 C)   98.5 F (36.9 C)  TempSrc: Oral   Oral  SpO2: 98%  100% 99%  Weight:  74 kg     Body mass index is 22.59 kg/m.   General: Alert awake, not in obvious distress, blindness HENT: pupils equally reacting to light,  No scleral pallor or icterus noted. Oral mucosa is moist.  Chest:   Diminished breath sounds bilaterally. No crackles or wheezes.  CVS: S1 &S2 heard. No murmur.  Regular rate and rhythm. Abdomen: Soft, nontender, nondistended.  Bowel sounds are  heard.   Extremities: No cyanosis, clubbing or edema.  Peripheral pulses are palpable. Psych: Alert, awake and oriented, normal mood CNS:  No cranial nerve deficits.  Moves all extremities. Skin: Warm and dry.  No rashes noted.  The results of significant diagnostics from this hospitalization (including imaging, microbiology, ancillary and laboratory) are listed below for reference.     Diagnostic Studies:   CT Renal Stone Study  Result Date: 02/17/2023 CLINICAL DATA:  Abdominal and flank pain EXAM: CT ABDOMEN AND PELVIS WITHOUT CONTRAST TECHNIQUE: Multidetector CT imaging of the abdomen and pelvis was performed following the standard protocol without IV contrast. RADIATION DOSE REDUCTION: This exam was performed according to the departmental dose-optimization program which includes automated exposure control, adjustment of the mA and/or kV according to patient size and/or use of iterative reconstruction technique. COMPARISON:  None Available. FINDINGS: Lower chest: No acute abnormality. Hepatobiliary: No focal liver abnormality is seen. No gallstones, gallbladder wall thickening, or biliary dilatation. Pancreas: There is diffuse fatty infiltration of the pancreas. No acute abnormality. Spleen: Normal in size without focal abnormality. Adrenals/Urinary Tract: There are 2 small right adrenal nodules which are indeterminate. These measure 1 cm. The left adrenal gland bilateral kidneys appear within normal limits. Bladder is markedly distended. Stomach/Bowel: Rectum is dilated and stool-filled. There is rectal wall thickening with surrounding inflammation and presacral edema. There is a large left inguinal hernia containing nondilated sigmoid colon. There is no bowel obstruction, pneumatosis or free air. The appendix, small bowel and stomach are within normal limits. Vascular/Lymphatic: Aortic atherosclerosis. No enlarged abdominal or pelvic lymph nodes. Reproductive: Prostate is unremarkable. Other: There  is no ascites. There is a small fat containing umbilical hernia. Musculoskeletal: No acute or significant osseous findings. IMPRESSION: 1. Findings compatible with stercoral colitis/proctitis. 2. Large left inguinal hernia containing nondilated sigmoid colon. 3. Marked distention of the bladder. 4. Indeterminate right adrenal nodules measuring 1 cm. Recommend nonemergent follow-up with adrenal CT or MRI. Aortic Atherosclerosis (ICD10-I70.0). Electronically Signed   By: Amy  Guttmann M.D.   On: 02/17/2023 21:02     Labs:   Basic Metabolic Panel: Recent Labs  Lab 02/17/23 1805 02/17/23 2310 02/18/23 0049 02/18/23 0833  NA 136  --  137 139  K 2.4*  --  2.6* 3.2*  CL 97*  --  98 103  CO2 25  --  23 23  GLUCOSE 132*  --  131* 118*  BUN 11  --  10 9  CREATININE 0.96  --  0.99 0.77  CALCIUM 9.0  --  8.9 8.5*  MG  --  1.6* 1.5*  --    GFR Estimated Creatinine Clearance: 86.1 mL/min (by C-G formula based on SCr of 0.77 mg/dL). Liver Function Tests: Recent Labs  Lab 02/17/23 1805  AST 21  ALT 18  ALKPHOS 74  BILITOT 0.7  PROT 7.2  ALBUMIN 3.8   Recent Labs    Lab 02/17/23 1805  LIPASE 25   No results for input(s): "AMMONIA" in the last 168 hours. Coagulation profile No results for input(s): "INR", "PROTIME" in the last 168 hours.  CBC: Recent Labs  Lab 02/17/23 1805 02/18/23 0049 02/19/23 0803  WBC 15.0* 12.7* 9.0  NEUTROABS 11.4*  --   --   HGB 14.4 13.1 12.5*  HCT 42.0 38.5* 37.3*  MCV 87.7 86.7 89.7  PLT 385 324 263   Cardiac Enzymes: No results for input(s): "CKTOTAL", "CKMB", "CKMBINDEX", "TROPONINI" in the last 168 hours. BNP: Invalid input(s): "POCBNP" CBG: No results for input(s): "GLUCAP" in the last 168 hours. D-Dimer No results for input(s): "DDIMER" in the last 72 hours. Hgb A1c No results for input(s): "HGBA1C" in the last 72 hours. Lipid Profile No results for input(s): "CHOL", "HDL", "LDLCALC", "TRIG", "CHOLHDL", "LDLDIRECT" in the last 72  hours. Thyroid function studies No results for input(s): "TSH", "T4TOTAL", "T3FREE", "THYROIDAB" in the last 72 hours.  Invalid input(s): "FREET3" Anemia work up No results for input(s): "VITAMINB12", "FOLATE", "FERRITIN", "TIBC", "IRON", "RETICCTPCT" in the last 72 hours. Microbiology No results found for this or any previous visit (from the past 240 hour(s)).   Discharge Instructions:   Discharge Instructions     Diet - low sodium heart healthy   Complete by: As directed    Discharge instructions   Complete by: As directed    Please make sure you move your bowel movement everyday. Follow up your primary doctor in one week, check blood work at that time.   Increase activity slowly   Complete by: As directed       Allergies as of 02/19/2023       Reactions   Allyl Isothiocyanate Hives, Nausea And Vomiting   Mustard   Other Hives, Nausea And Vomiting   Mayonnaise         Medication List     STOP taking these medications    omeprazole 20 MG capsule Commonly known as: PRILOSEC   tadalafil 5 MG tablet Commonly known as: CIALIS       TAKE these medications    cholecalciferol 25 MCG (1000 UNIT) tablet Commonly known as: VITAMIN D3 Take 1,000 Units by mouth daily.   cyanocobalamin 1000 MCG tablet Commonly known as: VITAMIN B12 Take 1,000 mcg by mouth daily.   ferrous sulfate 325 (65 FE) MG tablet Take 325 mg by mouth See admin instructions.   folic acid 1 MG tablet Commonly known as: FOLVITE Take 1 tablet by mouth daily.   furosemide 40 MG tablet Commonly known as: LASIX Take 40 mg by mouth daily.   polyethylene glycol 17 g packet Commonly known as: MIRALAX / GLYCOLAX Take 17 g by mouth daily. To keep your bowel movements daily   potassium chloride SA 20 MEQ tablet Commonly known as: KLOR-CON M Take 2 tablets (40 mEq total) by mouth daily for 14 days.   tamsulosin 0.4 MG Caps capsule Commonly known as: FLOMAX Take 1 capsule (0.4 mg total) by  mouth 2 (two) times daily.        Follow-up Information     primary care physician Follow up in 1 week(s).                   Time coordinating discharge: 39 minutes  Signed:  Jeyren Danowski  [facility] 02/19/2023, 9:29 AM          

## 2023-02-19 NOTE — Progress Notes (Signed)
Pt's primary nurse Porsche reports that pt's ride home will be coming at 1:30pm (his ride has a meeting at 12 noon, and will be coming after that).

## 2023-02-19 NOTE — Progress Notes (Signed)
Pt's son called voicing that the discharge script for the potassium did not match what the pharmacy had in their records. MD notified of the above via secure chat

## 2023-02-25 DIAGNOSIS — S61215A Laceration without foreign body of left ring finger without damage to nail, initial encounter: Secondary | ICD-10-CM | POA: Diagnosis not present

## 2023-03-04 DIAGNOSIS — S61215D Laceration without foreign body of left ring finger without damage to nail, subsequent encounter: Secondary | ICD-10-CM | POA: Diagnosis not present

## 2023-04-05 ENCOUNTER — Other Ambulatory Visit (INDEPENDENT_AMBULATORY_CARE_PROVIDER_SITE_OTHER): Payer: Medicare Other

## 2023-04-05 DIAGNOSIS — R972 Elevated prostate specific antigen [PSA]: Secondary | ICD-10-CM

## 2023-04-05 LAB — PSA: PSA: 6.21 ng/mL — ABNORMAL HIGH (ref 0.10–4.00)

## 2023-05-02 ENCOUNTER — Other Ambulatory Visit: Payer: Self-pay | Admitting: Internal Medicine

## 2023-05-09 ENCOUNTER — Other Ambulatory Visit: Payer: Self-pay | Admitting: Internal Medicine

## 2023-08-13 ENCOUNTER — Encounter: Payer: Self-pay | Admitting: Internal Medicine

## 2023-08-13 ENCOUNTER — Other Ambulatory Visit: Payer: Self-pay | Admitting: Internal Medicine

## 2023-08-13 MED ORDER — TAMSULOSIN HCL 0.4 MG PO CAPS: 0.4000 mg | ORAL_CAPSULE | Freq: Every day | ORAL | 0 refills | Status: DC

## 2023-10-04 ENCOUNTER — Other Ambulatory Visit: Payer: Self-pay | Admitting: Internal Medicine

## 2023-10-14 ENCOUNTER — Other Ambulatory Visit: Payer: Self-pay | Admitting: Internal Medicine

## 2023-10-14 DIAGNOSIS — R972 Elevated prostate specific antigen [PSA]: Secondary | ICD-10-CM

## 2023-11-03 ENCOUNTER — Other Ambulatory Visit (INDEPENDENT_AMBULATORY_CARE_PROVIDER_SITE_OTHER): Payer: Medicare Other

## 2023-11-03 DIAGNOSIS — R972 Elevated prostate specific antigen [PSA]: Secondary | ICD-10-CM

## 2023-11-03 LAB — COMPREHENSIVE METABOLIC PANEL
ALT: 16 U/L (ref 0–53)
AST: 15 U/L (ref 0–37)
Albumin: 4.4 g/dL (ref 3.5–5.2)
Alkaline Phosphatase: 72 U/L (ref 39–117)
BUN: 13 mg/dL (ref 6–23)
CO2: 29 meq/L (ref 19–32)
Calcium: 9.4 mg/dL (ref 8.4–10.5)
Chloride: 105 meq/L (ref 96–112)
Creatinine, Ser: 1.03 mg/dL (ref 0.40–1.50)
GFR: 71.77 mL/min (ref >60.00)
Glucose, Bld: 88 mg/dL (ref 70–99)
Potassium: 4.3 meq/L (ref 3.5–5.1)
Sodium: 141 meq/L (ref 135–145)
Total Bilirubin: 1.7 mg/dL — ABNORMAL HIGH (ref 0.2–1.2)
Total Protein: 6.7 g/dL (ref 6.0–8.3)

## 2023-11-03 LAB — PSA: PSA: 7.24 ng/mL — ABNORMAL HIGH (ref 0.10–4.00)

## 2023-11-03 LAB — CBC
HCT: 43.9 % (ref 39.0–52.0)
Hemoglobin: 14.5 g/dL (ref 13.0–17.0)
MCHC: 32.9 g/dL (ref 30.0–36.0)
MCV: 96.6 fL (ref 78.0–100.0)
Platelets: 275 10*3/uL (ref 150.0–400.0)
RBC: 4.54 Mil/uL (ref 4.22–5.81)
RDW: 15.3 % (ref 11.5–15.5)
WBC: 5 10*3/uL (ref 4.0–10.5)

## 2023-11-04 ENCOUNTER — Encounter: Payer: Self-pay | Admitting: Internal Medicine

## 2023-11-10 ENCOUNTER — Encounter: Payer: Self-pay | Admitting: Internal Medicine

## 2023-11-10 ENCOUNTER — Telehealth: Payer: Self-pay | Admitting: Internal Medicine

## 2023-11-10 ENCOUNTER — Ambulatory Visit: Payer: Medicare Other | Admitting: Internal Medicine

## 2023-11-10 VITALS — BP 104/70 | HR 75 | Temp 98.6°F | Resp 97 | Ht 71.0 in | Wt 184.0 lb

## 2023-11-10 DIAGNOSIS — Z1211 Encounter for screening for malignant neoplasm of colon: Secondary | ICD-10-CM

## 2023-11-10 DIAGNOSIS — Z0000 Encounter for general adult medical examination without abnormal findings: Secondary | ICD-10-CM

## 2023-11-10 DIAGNOSIS — K219 Gastro-esophageal reflux disease without esophagitis: Secondary | ICD-10-CM

## 2023-11-10 DIAGNOSIS — Z23 Encounter for immunization: Secondary | ICD-10-CM

## 2023-11-10 DIAGNOSIS — M159 Polyosteoarthritis, unspecified: Secondary | ICD-10-CM | POA: Diagnosis not present

## 2023-11-10 DIAGNOSIS — R972 Elevated prostate specific antigen [PSA]: Secondary | ICD-10-CM

## 2023-11-10 DIAGNOSIS — N401 Enlarged prostate with lower urinary tract symptoms: Secondary | ICD-10-CM

## 2023-11-10 DIAGNOSIS — N138 Other obstructive and reflux uropathy: Secondary | ICD-10-CM | POA: Diagnosis not present

## 2023-11-10 NOTE — Assessment & Plan Note (Signed)
 I have personally reviewed the Medicare Annual Wellness questionnaire and have noted 1. The patient's medical and social history 2. Their use of alcohol, tobacco or illicit drugs 3. Their current medications and supplements 4. The patient's functional ability including ADL's, fall risks, home safety risks and hearing or visual             impairment. 5. Diet and physical activities 6. Evidence for depression or mood disorders  The patients weight, height, BMI and visual acuity have been recorded in the chart I have made referrals, counseling and provided education to the patient based review of the above and I have provided the pt with a written personalized care plan for preventive services.  I have provided you with a copy of your personalized plan for preventive services. Please take the time to review along with your updated medication list.  Exercises regularly Due for last screening colon---done 2015 in [facility] Flu vaccine today Consider shingrix and RSV at pharmacy. Recommended COVID vaccine

## 2023-11-10 NOTE — Assessment & Plan Note (Signed)
 Does exercises and uses ibuprofen prn

## 2023-11-10 NOTE — Assessment & Plan Note (Signed)
 Has been stable over the years---consider recheck next year

## 2023-11-10 NOTE — Assessment & Plan Note (Signed)
 Side effects with the cialis --so stopped Continues on daily tamsulosin  If worsens, will add finasteride 5 daily

## 2023-11-10 NOTE — Assessment & Plan Note (Signed)
 Uses the omeprazole  prn with success

## 2023-11-10 NOTE — Progress Notes (Signed)
 Hearing Screening - Comments:: Passed whisper test Vision Screening - Comments:: December 2024

## 2023-11-10 NOTE — Telephone Encounter (Signed)
 Please set up for this spring, when possible.  Thanks.

## 2023-11-10 NOTE — Progress Notes (Signed)
 Subjective:    Patient ID: Craig Jackson, male    DOB: 09/05/1951, 73 y.o.   MRN: [id number]  HPI Here for Medicare wellness visit and follow up of chronic health conditions Reviewed advanced directives Reviewed other doctors---Dr Matthews--ophthal,  Dr Robley No hospitalizations or surgery in the past year Still exercises regularly---walking 2 miles and chair aerobics daily Regular beer No tobacco Vision is fine Hearing is good No falls No depression or anhedonia Independent with instrumental ADLs Memory is fine  Thinks the cialis  generic has given him muscle aches Taking daily but then stopped it Myalgias gone now Urine flow not as good but still acceptable on the tamsulosin  Not using for ED Also on OTC product with saw palmetto  Gets episodic heartburn---if eats Mexican, etc Uses the omeprazole  as needed No dysphagia  Mild joint pains---mostly hands Slight enlargement of knuckles Does hand exercises---and uses glucosamine chondroitin  Current Outpatient Medications on File Prior to Visit  Medication Sig Dispense Refill   omeprazole  (PRILOSEC) 20 MG capsule Take 20 mg by mouth daily as needed.     tamsulosin  (FLOMAX ) 0.4 MG CAPS capsule Take 1 capsule (0.4 mg total) by mouth daily. 90 capsule 0   No current facility-administered medications on file prior to visit.    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   GERD (gastroesophageal reflux disease)    Gout     Past Surgical History:  Procedure Laterality Date   APPENDECTOMY     CATARACT EXTRACTION     EYE SURGERY  2016   detached retina   ROTATOR CUFF REPAIR     SHOULDER SURGERY Left 1972   separation    Family History  Problem Relation Age of Onset   Arthritis Mother    Atrial fibrillation Father    Depression Sister         from chronic pain   Arthritis Sister    Dementia Sister    Heart disease Neg Hx    Diabetes Neg Hx    Cancer Neg Hx     Social History   Socioeconomic History   Marital status: Married    Spouse name: Not on file   Number of children: 1   Years of education: Not on file   Highest education level: Not on file  Occupational History   Occupation: Scientist, research (physical sciences)    Comment: retired  Tobacco Use   Smoking status: Never    Passive exposure: Never   Smokeless tobacco: Former    Types: Chew    Quit date: 09/03/2017   Tobacco comments:    only occasionally  Vaping Use   Vaping status: Never Used  Substance and Sexual Activity   Alcohol use: Yes    Alcohol/week: 2.0 standard drinks of alcohol    Types: 2 Cans of beer per week   Drug use: No   Sexual activity: Never  Other Topics Concern   Not on file  Social History Narrative   1 daughter---local   State graduate      Has living will   Wife is health care POA--daughter is alternate   Would accept resuscitation   Not sure about feeding tube--but certainly not prolonged    Social Drivers of Corporate Investment Banker Strain: Not on file  Food Insecurity:  Low Risk  (04/21/2023)   Received from [facility]   Hunger Vital Sign    Worried About Running Out of Food in the Last Year: Never true    Ran Out of Food in the Last Year: Never true  Transportation Needs: No Transportation Needs (04/21/2023)   Received from [facility]    In the past 12 months, has lack of reliable transportation kept you from medical appointments, meetings, work or from getting things needed for daily living? : No  Physical Activity: Not on file  Stress: Not on file  Social Connections: Not on file  Intimate Partner Violence: Not on file   Review of Systems Appetite is good Weight stable Sleep is fair---no daytime tiredness Wears seat belt Teeth are fine--keeps up with dentist (getting checked  today--possible cavity). Done with invisalign now Some spots he wants checked on skin--plans to establish with derm No chest pain or SOB No dizziness or syncope No palpitations Bowels move fine--no blood Some bursitis in left hip and some S-I pain after a lot of work at [employer] (or prolonged horseshoes)---aleve helps    Objective:   Physical Exam Constitutional:      Appearance: Normal appearance.  HENT:     Mouth/Throat:     Pharynx: No oropharyngeal exudate or posterior oropharyngeal erythema.  Eyes:     Conjunctiva/sclera: Conjunctivae normal.     Pupils: Pupils are equal, round, and reactive to light.  Cardiovascular:     Rate and Rhythm: Normal rate and regular rhythm.     Pulses: Normal pulses.     Heart sounds: No murmur heard.    No gallop.  Pulmonary:     Effort: Pulmonary effort is normal.     Breath sounds: Normal breath sounds. No wheezing or rales.  Abdominal:     Palpations: Abdomen is soft.     Tenderness: There is no abdominal tenderness.  Musculoskeletal:     Cervical back: Neck supple.     Right lower leg: No edema.     Left lower leg: No edema.  Lymphadenopathy:     Cervical: No cervical adenopathy.  Skin:    Findings: No lesion or rash.     Comments: 2 seborrheic keratoses were his concern spots  Neurological:     General: No focal deficit present.     Mental Status: He is alert and oriented to person, place, and time.     Comments: Mini-cog normal  Psychiatric:        Mood and Affect: Mood normal.        Behavior: Behavior normal.            Assessment & Plan:

## 2023-11-10 NOTE — Telephone Encounter (Signed)
 Requests transfer to Dr Rosella Conn wife sees him.   Patient would like to setup a TOC with Dr. Vallarie Gauze. His wife Vivek Grealish sees Dr. Vallarie Gauze. Please advise.

## 2023-11-10 NOTE — Telephone Encounter (Signed)
 LVM to inform patient Dr. Vallarie Gauze would allow TOC.

## 2023-11-10 NOTE — Addendum Note (Signed)
 Addended by: Franne Ivory on: 11/10/2023 10:20 AM   Modules accepted: Orders

## 2023-11-11 ENCOUNTER — Other Ambulatory Visit: Payer: Self-pay | Admitting: Internal Medicine

## 2023-11-11 ENCOUNTER — Telehealth: Payer: Self-pay | Admitting: Internal Medicine

## 2023-11-11 NOTE — Telephone Encounter (Signed)
 Okay, please set up.  Thanks.

## 2023-11-11 NOTE — Telephone Encounter (Signed)
 Copied from CRM [id number]. Topic: Appointments - Transfer of Care >> Nov 11, 2023  1:41 PM Drema MATSU wrote: Pt is requesting to transfer FROM: Dr. Jimmy  Pt is requesting to transfer TO: Dr. Cleatus  Reason for requested transfer: Dr. Jimmy is retiring It is the responsibility of the team the patient would like to transfer to (Dr. Cleatus) to reach out to the patient if for any reason this transfer is not acceptable.  * Patient wants to go ahead and schedule his lab(week before) and physical for next year.

## 2023-11-11 NOTE — Telephone Encounter (Signed)
 Please schedule patient.

## 2023-11-12 ENCOUNTER — Other Ambulatory Visit: Payer: Self-pay | Admitting: Internal Medicine

## 2023-11-12 NOTE — Telephone Encounter (Signed)
 Spoke to pt, scheduled toc/cpe for 11/13/24

## 2023-11-15 ENCOUNTER — Encounter: Payer: Self-pay | Admitting: Internal Medicine

## 2023-11-16 ENCOUNTER — Telehealth: Payer: Self-pay | Admitting: Internal Medicine

## 2023-11-16 DIAGNOSIS — Z1211 Encounter for screening for malignant neoplasm of colon: Secondary | ICD-10-CM

## 2023-11-16 NOTE — Telephone Encounter (Signed)
Attempted to contact pt to find out the reason for the referral. There was no answer and I could not leave a message.

## 2023-11-16 NOTE — Telephone Encounter (Signed)
Copied from CRM [id number]. Topic: Referral - Question >> Nov 16, 2023 10:24 AM Craig Jackson wrote: Reason for CRM: Patient called in wanting to know if he could get a referral for Dr. Melburn Popper [facility] [phone] / please call [phone]

## 2023-11-17 ENCOUNTER — Telehealth: Payer: Self-pay

## 2023-11-17 NOTE — Telephone Encounter (Signed)
Contacted patient again to let him know I called him in error because his referral was sent to [facility] according to what's in Epic.  He said he had asked for them to change the referral to [facility]-Dr. Tobi Bastos because his wife had hers with Dr. Tobi Bastos.  Informed him that I would message his PCP to ask them to change the referral.  Thanks,  Marcelino Duster, CMA

## 2023-11-17 NOTE — Telephone Encounter (Signed)
[phone]  Per pt would like a call to setup procedure / Colonoscopy.

## 2023-11-18 NOTE — Addendum Note (Signed)
Addended by: Tillman Abide I on: 11/18/2023 07:59 AM   Modules accepted: Orders

## 2023-11-19 NOTE — Telephone Encounter (Signed)
Pt needs a referral for colonoscopy with Dr Tobi Bastos. He will follow-up with them on scheduling.

## 2023-11-19 NOTE — Addendum Note (Signed)
Addended by: Eual Fines on: 11/19/2023 09:37 AM   Modules accepted: Orders

## 2024-05-23 ENCOUNTER — Telehealth: Payer: Self-pay | Admitting: Internal Medicine

## 2024-05-23 DIAGNOSIS — Z1211 Encounter for screening for malignant neoplasm of colon: Secondary | ICD-10-CM

## 2024-05-23 NOTE — Telephone Encounter (Signed)
 Left message advising pt the request was sent. Did not say what it was for.

## 2024-05-23 NOTE — Telephone Encounter (Signed)
 Copied from CRM [id number]. Topic: Referral - Request for Referral >> May 23, 2024 11:43 AM Thersia BROCKS wrote: Did the patient discuss referral with their provider in the last year? Yes (If No - schedule appointment) (If Yes - send message)  Appointment offered? Yes  Type of order/referral and detailed reason for visit: Colonoscopy   Preference of office, provider, location:  Ruel Kung, MD, MRCP  [facility] - [facility] [address] [place], [place] [address] Office: [phone]  Fax: [phone] If referral order, have you been seen by this specialty before? Yes (If Yes, this issue or another issue? When? Where?  Can we respond through MyChart? Yes

## 2024-06-02 ENCOUNTER — Telehealth: Payer: Self-pay | Admitting: Internal Medicine

## 2024-06-02 NOTE — Addendum Note (Signed)
 Addended by: KALLIE CLOTILDA SQUIBB on: 06/02/2024 03:27 PM   Modules accepted: Orders

## 2024-06-02 NOTE — Telephone Encounter (Signed)
 Copied from CRM [id number]. Topic: Referral - Status >> Jun 02, 2024  2:14 PM Martinique E wrote: Reason for CRM: Patient would like to know the status of his colonoscopy referral, as he stated he contacted [facility] and they do not have this referral for him. Relayed to patient was the referral is authorized that he should be getting a call to get an appointment scheduled. Patient questioning when he will hear back. Callback number [phone].

## 2024-06-02 NOTE — Telephone Encounter (Signed)
 The referral was not complete. Because a location or Doctor was not added, it was just sitting in Queue pending. I have added Dr Williemae information. Hopefully that will help get it done for him now.

## 2024-06-09 ENCOUNTER — Encounter: Payer: Self-pay | Admitting: *Deleted

## 2024-06-09 NOTE — Telephone Encounter (Signed)
 Thank you :)

## 2024-06-09 NOTE — Telephone Encounter (Signed)
 Referral has been faxed to [facility]  Patient has been notified via Mychart letter and msg of referral information and where to call to schedule if he does not hear from them.   See referral for updates.

## 2024-06-09 NOTE — Telephone Encounter (Signed)
 Copied from CRM [id number]. Topic: Referral - Status >> Jun 09, 2024 11:13 AM Pinkey ORN wrote: Reason for CRM: Referral >> Jun 09, 2024 11:14 AM Pinkey ORN wrote: Patient states [facility] states they haven't received anything in regards to patients referral. Patient states that fax # is [phone] . Patient states he's wanting to have his appointment scheduled in October.

## 2024-08-02 NOTE — Telephone Encounter (Signed)
 Patient requests PCP be updated to new PCP - Dr Cleatus.   Thanks!

## 2024-08-09 ENCOUNTER — Ambulatory Visit (INDEPENDENT_AMBULATORY_CARE_PROVIDER_SITE_OTHER): Admitting: Dermatology

## 2024-08-09 DIAGNOSIS — Z7189 Other specified counseling: Secondary | ICD-10-CM

## 2024-08-09 DIAGNOSIS — L219 Seborrheic dermatitis, unspecified: Secondary | ICD-10-CM | POA: Diagnosis not present

## 2024-08-09 DIAGNOSIS — D1801 Hemangioma of skin and subcutaneous tissue: Secondary | ICD-10-CM | POA: Diagnosis not present

## 2024-08-09 DIAGNOSIS — L719 Rosacea, unspecified: Secondary | ICD-10-CM | POA: Diagnosis not present

## 2024-08-09 DIAGNOSIS — Z79899 Other long term (current) drug therapy: Secondary | ICD-10-CM

## 2024-08-09 DIAGNOSIS — Z808 Family history of malignant neoplasm of other organs or systems: Secondary | ICD-10-CM

## 2024-08-09 DIAGNOSIS — W908XXA Exposure to other nonionizing radiation, initial encounter: Secondary | ICD-10-CM | POA: Diagnosis not present

## 2024-08-09 DIAGNOSIS — D492 Neoplasm of unspecified behavior of bone, soft tissue, and skin: Secondary | ICD-10-CM | POA: Diagnosis not present

## 2024-08-09 DIAGNOSIS — Z1283 Encounter for screening for malignant neoplasm of skin: Secondary | ICD-10-CM | POA: Diagnosis not present

## 2024-08-09 DIAGNOSIS — L821 Other seborrheic keratosis: Secondary | ICD-10-CM | POA: Diagnosis not present

## 2024-08-09 DIAGNOSIS — L578 Other skin changes due to chronic exposure to nonionizing radiation: Secondary | ICD-10-CM | POA: Diagnosis not present

## 2024-08-09 DIAGNOSIS — C4362 Malignant melanoma of left upper limb, including shoulder: Secondary | ICD-10-CM | POA: Diagnosis not present

## 2024-08-09 DIAGNOSIS — L570 Actinic keratosis: Secondary | ICD-10-CM

## 2024-08-09 DIAGNOSIS — C4359 Malignant melanoma of other part of trunk: Secondary | ICD-10-CM | POA: Diagnosis not present

## 2024-08-09 DIAGNOSIS — L814 Other melanin hyperpigmentation: Secondary | ICD-10-CM

## 2024-08-09 DIAGNOSIS — D229 Melanocytic nevi, unspecified: Secondary | ICD-10-CM

## 2024-08-09 DIAGNOSIS — C439 Malignant melanoma of skin, unspecified: Secondary | ICD-10-CM

## 2024-08-09 HISTORY — DX: Malignant melanoma of skin, unspecified: C43.9

## 2024-08-09 NOTE — Patient Instructions (Addendum)
 Cryotherapy Aftercare  Wash gently with soap and water everyday.   Apply Vaseline and Band-Aid daily until healed.    Wound Care Instructions  Cleanse wound gently with soap and water once a day then pat dry with clean gauze. Apply a thin coat of Petrolatum (petroleum jelly, Vaseline) over the wound (unless you have an allergy to this). We recommend that you use a new, sterile tube of Vaseline. Do not pick or remove scabs. Do not remove the yellow or white healing tissue from the base of the wound.  Cover the wound with fresh, clean, nonstick gauze and secure with paper tape. You may use Band-Aids in place of gauze and tape if the wound is small enough, but would recommend trimming much of the tape off as there is often too much. Sometimes Band-Aids can irritate the skin.  You should call the office for your biopsy report after 1 week if you have not already been contacted.  If you experience any problems, such as abnormal amounts of bleeding, swelling, significant bruising, significant pain, or evidence of infection, please call the office immediately.  FOR ADULT SURGERY PATIENTS: If you need something for pain relief you may take 1 extra strength Tylenol (acetaminophen) AND 2 Ibuprofen (200mg  each) together every 4 hours as needed for pain. (do not take these if you are allergic to them or if you have a reason you should not take them.) Typically, you may only need pain medication for 1 to 3 days.      Actinic Keratosis  What is an actinic keratosis? An actinic keratosis (plural: actinic keratoses) is growth on the surface of the skin that usually appears as a red, hard, crusty or scaly bump.   What causes actinic keratoses? Repeated prolonged sun exposure causes skin damage, especially in fair-skinned persons. Sun-damaged skin becomes dry and wrinkled and may form rough, scaly spots called actinic keratoses. These rough spots remain on the skin even though the crust or scale on top  is picked off.   Why treat actinic keratoses? Actinic keratoses are not skin cancers, but because they may sometimes turn cancerous they are called "pre-cancerous". Not all will turn to skin cancer, and it usually takes several years for this to happen. Because it is much easier to treat an actinic keratosis then it is to remove a skin cancer, actinic keratoses should be treated to prevent future skin cancer.   How are actinic keratoses treated? The most common way of treating actinic keratoses is to freeze them with liquid nitrogen. Freezing causes scabbing and shedding of the sun-damaged skin. Healing after a removal usually takes two weeks, depending on the size and location of the keratosis. Hands and legs heal more slowly than the face. The skin's final appearance is usually excellent. There are several topical medications that can be used to treat actinic keratoses. These medications generally have side effects of redness, crusting, and pain. Some are used for a few days, and some for several months before the actinic keratosis is completely gone. Photodynamic therapy is another alternative to freezing actinic keratoses. This treatment is done in a physician's office. A medication is applied to the area of skin with actinic keratoses, and it is allowed to soak in for one or more hours. A special light is then applied to the skin. Side effects include redness, burning, and peeling.  How can you prevent actinic keratoses? Protection from the sun is the best way to prevent actinic keratoses. The use of proper  clothing and sunscreens can prevent the sun damage that leads to an actinic keratosis.  Unfortunately, some sun damage is permanent. Once sun damage has progressed to the point where actinic keratoses develop, new keratoses may appear even without further sun exposure. However, even in skin that is already heavily sun damaged, good sun protection can help reduce the number of actinic keratoses that  will appear.   Seborrheic Keratosis  Face, right lower leg, back What causes seborrheic keratoses? Seborrheic keratoses are harmless, common skin growths that first appear during adult life.  As time goes by, more growths appear.  Some people may develop a large number of them.  Seborrheic keratoses appear on both covered and uncovered body parts.  They are not caused by sunlight.  The tendency to develop seborrheic keratoses can be inherited.  They vary in color from skin-colored to gray, brown, or even black.  They can be either smooth or have a rough, warty surface.   Seborrheic keratoses are superficial and look as if they were stuck on the skin.  Under the microscope this type of keratosis looks like layers upon layers of skin.  That is why at times the top layer may seem to fall off, but the rest of the growth remains and re-grows.    Treatment Seborrheic keratoses do not need to be treated, but can easily be removed in the office.  Seborrheic keratoses often cause symptoms when they rub on clothing or jewelry.  Lesions can be in the way of shaving.  If they become inflamed, they can cause itching, soreness, or burning.  Removal of a seborrheic keratosis can be accomplished by freezing, burning, or surgery. If any spot bleeds, scabs, or grows rapidly, please return to have it checked, as these can be an indication of a skin cancer.   Seborrheic Dermatitis  Face  -  is a chronic persistent rash characterized by pinkness and scaling most commonly of the mid face but also can occur on the scalp (dandruff), ears; mid chest, mid back and groin.  It tends to be exacerbated by stress and cooler weather.  People who have neurologic disease may experience new onset or exacerbation of existing seborrheic dermatitis.  The condition is not curable but treatable and can be controlled.   Continue over the counter Hydrocortisone cream as needed for flares   Due to recent changes in healthcare laws, you  may see results of your pathology and/or laboratory studies on MyChart before the doctors have had a chance to review them. We understand that in some cases there may be results that are confusing or concerning to you. Please understand that not all results are received at the same time and often the doctors may need to interpret multiple results in order to provide you with the best plan of care or course of treatment. Therefore, we ask that you please give us  2 business days to thoroughly review all your results before contacting the office for clarification. Should we see a critical lab result, you will be contacted sooner.   If You Need Anything After Your Visit  If you have any questions or concerns for your doctor, please call our main line at [phone] and press option 4 to reach your doctor's medical assistant. If no one answers, please leave a voicemail as directed and we will return your call as soon as possible. Messages left after 4 pm will be answered the following business day.   You may also send us  a  message via MyChart. We typically respond to MyChart messages within 1-2 business days.  For prescription refills, please ask your pharmacy to contact our office. Our fax number is [phone].  If you have an urgent issue when the clinic is closed that cannot wait until the next business day, you can page your doctor at the number below.    Please note that while we do our best to be available for urgent issues outside of office hours, we are not available 24/7.   If you have an urgent issue and are unable to reach us , you may choose to seek medical care at your doctor's office, retail clinic, urgent care center, or emergency room.  If you have a medical emergency, please immediately call 911 or go to the emergency department.  Pager Numbers  - Dr. Hester: [phone]  - Dr. Jackquline: [phone]  - Dr. Claudene: [phone]   - Dr. Raymund: [phone]  In the event of  inclement weather, please call our main line at [phone] for an update on the status of any delays or closures.  Dermatology Medication Tips: Please keep the boxes that topical medications come in in order to help keep track of the instructions about where and how to use these. Pharmacies typically print the medication instructions only on the boxes and not directly on the medication tubes.   If your medication is too expensive, please contact our office at [phone] option 4 or send us  a message through MyChart.   We are unable to tell what your co-pay for medications will be in advance as this is different depending on your insurance coverage. However, we may be able to find a substitute medication at lower cost or fill out paperwork to get insurance to cover a needed medication.   If a prior authorization is required to get your medication covered by your insurance company, please allow us  1-2 business days to complete this process.  Drug prices often vary depending on where the prescription is filled and some pharmacies may offer cheaper prices.  The website www.goodrx.com contains coupons for medications through different pharmacies. The prices here do not account for what the cost may be with help from insurance (it may be cheaper with your insurance), but the website can give you the price if you did not use any insurance.  - You can print the associated coupon and take it with your prescription to the pharmacy.  - You may also stop by our office during regular business hours and pick up a GoodRx coupon card.  - If you need your prescription sent electronically to a different pharmacy, notify our office through [facility] or by phone at [phone] option 4.     Si Usted Necesita Algo Despus de Su Visita  Tambin puede enviarnos un mensaje a travs de Clinical Cytogeneticist. Por lo general respondemos a los mensajes de MyChart en el transcurso de 1 a 2 das hbiles.  Para renovar  recetas, por favor pida a su farmacia que se ponga en contacto con nuestra oficina. Randi lakes de fax es [place] [phone].  Si tiene un asunto urgente cuando la clnica est cerrada y que no puede esperar hasta el siguiente da hbil, puede llamar/localizar a su doctor(a) al nmero que aparece a continuacin.   Por favor, tenga en cuenta que aunque hacemos todo lo posible para estar disponibles para asuntos urgentes fuera del horario de [place], no estamos disponibles las 24 horas del da, los 7 [address]  [phone] de la [place].  Si tiene un problema urgente y no puede comunicarse con nosotros, puede optar por buscar atencin mdica  en el consultorio de su doctor(a), en una clnica privada, en un centro de atencin urgente o en una sala de emergencias.  Si tiene engineer, drilling, por favor llame inmediatamente al 911 o vaya a la sala de emergencias.  Nmeros de bper  - Dr. Hester: [phone]  - Dra. Jackquline: [phone]  - Dr. Claudene: [phone]  - Dra. Kitts: [phone]  En caso de inclemencias del [place], por favor llame a nuestra lnea principal al [phone] para una actualizacin sobre el estado de cualquier retraso o cierre.  Consejos para la medicacin en dermatologa: Por favor, guarde las cajas en las que vienen los medicamentos de uso tpico para ayudarle a seguir las instrucciones sobre dnde y cmo usarlos. Las farmacias generalmente imprimen las instrucciones del medicamento slo en las cajas y no directamente en los tubos del [place].   Si su medicamento es muy caro, por favor, pngase en contacto con landry rieger llamando al [phone] y presione la opcin 4 o envenos un mensaje a travs de Clinical Cytogeneticist.   No podemos decirle cul ser su copago por los medicamentos por adelantado ya que esto es diferente dependiendo de la cobertura de su seguro. Sin embargo, es posible que podamos encontrar un medicamento sustituto a audiological scientist un formulario para que el  seguro cubra el medicamento que se considera necesario.   Si se requiere una autorizacin previa para que su compaa de seguros cubra su medicamento, por favor permtanos de 1 a 2 das hbiles para completar este proceso.  Los precios de los medicamentos varan con frecuencia dependiendo del environmental consultant de dnde se surte la receta y alguna farmacias pueden ofrecer precios ms baratos.  El sitio web www.goodrx.com tiene cupones para medicamentos de health and safety inspector. Los precios aqu no tienen en cuenta lo que podra costar con la ayuda del seguro (puede ser ms barato con su seguro), pero el sitio web puede darle el precio si no utiliz tourist information centre manager.  - Puede imprimir el cupn correspondiente y llevarlo con su receta a la farmacia.  - Tambin puede pasar por nuestra oficina durante el horario de atencin regular y education officer, museum una tarjeta de cupones de GoodRx.  - Si necesita que su receta se enve electrnicamente a una farmacia diferente, informe a nuestra oficina a travs de MyChart de  o por telfono llamando al [phone] y presione la opcin 4.

## 2024-08-09 NOTE — Progress Notes (Signed)
 New Patient Visit   Subjective  Craig Jackson is a 73 y.o. male who presents for the following: Skin Cancer Screening and Full Body Skin Exam, check crusty sore spots R lower leg x 2, L post shoulder, cyst L back, L neck, no hx of skin cancer, Father with hx of skin cancer BCC  The patient presents for Total-Body Skin Exam (TBSE) for skin cancer screening and mole check. The patient has spots, moles and lesions to be evaluated, some may be new or changing and the patient may have concern these could be cancer.  The following portions of the chart were reviewed this encounter and updated as appropriate: medications, allergies, medical history  Review of Systems:  No other skin or systemic complaints except as noted in HPI or Assessment and Plan.  Objective  Well appearing patient in no apparent distress; mood and affect are within normal limits.  A full examination was performed including scalp, head, eyes, ears, nose, lips, neck, chest, axillae, abdomen, back, buttocks, bilateral upper extremities, bilateral lower extremities, hands, feet, fingers, toes, fingernails, and toenails. All findings within normal limits unless otherwise noted below.   Relevant physical exam findings are noted in the Assessment and Plan.  L cheek x 1 Pink scaly macules L medial pectoral 0.7cm irregular brown flat pap   Assessment & Plan   SKIN CANCER SCREENING PERFORMED TODAY.  ACTINIC DAMAGE - Chronic condition, secondary to cumulative UV/sun exposure - diffuse scaly erythematous macules with underlying dyspigmentation - Recommend daily broad spectrum sunscreen SPF 30+ to sun-exposed areas, reapply every 2 hours as needed.  - Staying in the shade or wearing long sleeves, sun glasses (UVA+UVB protection) and wide brim hats (4-inch brim around the entire circumference of the hat) are also recommended for sun protection.  - Call for new or changing lesions.  LENTIGINES, SEBORRHEIC KERATOSES,  HEMANGIOMAS - Benign normal skin lesions - Benign-appearing - Call for any changes  MELANOCYTIC NEVI - Tan-brown and/or pink-flesh-colored symmetric macules and papules - Benign appearing on exam today - Observation - Call clinic for new or changing moles - Recommend daily use of broad spectrum spf 30+ sunscreen to sun-exposed areas.   FAMILY HISTORY OF SKIN CANCER What type(s): BCC Who affected: Father   ROSACEA Mild face Exam erythema with telangiectasias nose Chronic and persistent condition with duration or expected duration over one year. Condition is symptomatic / bothersome to patient. Not to goal. Rosacea is a chronic progressive skin condition usually affecting the face of adults, causing redness and/or acne bumps. It is treatable but not curable. It sometimes affects the eyes (ocular rosacea) as well. It may respond to topical and/or systemic medication and can flare with stress, sun exposure, alcohol, exercise, topical steroids (including hydrocortisone/cortisone 10) and some foods.  Daily application of broad spectrum spf 30+ sunscreen to face is recommended to reduce flares. Treatment Plan Mild, no treatment today  SEBORRHEIC DERMATITIS Nasolabials, chin Exam: Pink patches with greasy scale at nasolabials, chin/beard area Chronic and persistent condition with duration or expected duration over one year. Condition is symptomatic / bothersome to patient. Not to goal. Seborrheic Dermatitis is a chronic persistent rash characterized by pinkness and scaling most commonly of the mid face but also can occur on the scalp (dandruff), ears; mid chest, mid back and groin.  It tends to be exacerbated by stress and cooler weather.  People who have neurologic disease may experience new onset or exacerbation of existing seborrheic dermatitis.  The condition is not  curable but treatable and can be controlled. Treatment Plan: Cont otc Cortisone cr prn flares  Topical steroids (such as  triamcinolone , fluocinolone, fluocinonide, mometasone, clobetasol, halobetasol, betamethasone, hydrocortisone) can cause thinning and lightening of the skin if they are used for too long in the same area. Your physician has selected the right strength medicine for your problem and area affected on the body. Please use your medication only as directed by your physician to prevent side effects.     AK (ACTINIC KERATOSIS) L cheek x 1 Actinic keratoses are precancerous spots that appear secondary to cumulative UV radiation exposure/sun exposure over time. They are chronic with expected duration over 1 year. A portion of actinic keratoses will progress to squamous cell carcinoma of the skin. It is not possible to reliably predict which spots will progress to skin cancer and so treatment is recommended to prevent development of skin cancer.  Recommend daily broad spectrum sunscreen SPF 30+ to sun-exposed areas, reapply every 2 hours as needed.  Recommend staying in the shade or wearing long sleeves, sun glasses (UVA+UVB protection) and wide brim hats (4-inch brim around the entire circumference of the hat). Call for new or changing lesions. Destruction of lesion - L cheek x 1 Complexity: simple   Destruction method: cryotherapy   Informed consent: discussed and consent obtained   Timeout:  patient name, date of birth, surgical site, and procedure verified Lesion destroyed using liquid nitrogen: Yes   Region frozen until ice ball extended beyond lesion: Yes   Outcome: patient tolerated procedure well with no complications   Post-procedure details: wound care instructions given    NEOPLASM OF SKIN L medial pectoral Epidermal / dermal shaving  Lesion diameter (cm):  0.7 Informed consent: discussed and consent obtained   Timeout: patient name, date of birth, surgical site, and procedure verified   Procedure prep:  Patient was prepped and draped in usual sterile fashion Prep type:  Isopropyl  alcohol Anesthesia: the lesion was anesthetized in a standard fashion   Anesthetic:  1% lidocaine w/ epinephrine  1-100,000 buffered w/ 8.4% NaHCO3 Instrument used: flexible razor blade   Hemostasis achieved with: pressure, aluminum chloride and electrodesiccation   Outcome: patient tolerated procedure well   Post-procedure details: sterile dressing applied and wound care instructions given   Dressing type: bandage and petrolatum    Specimen 1 - Surgical pathology Differential Diagnosis: Nevus vs Dysplastic Nevus  Check Margins: yes 0.7cm irregular brown flat pap SKIN CANCER SCREENING   ACTINIC SKIN DAMAGE   LENTIGO   MELANOCYTIC NEVUS, UNSPECIFIED LOCATION   FAMILY HISTORY OF SKIN CANCER   ROSACEA   SEBORRHEIC DERMATITIS   COUNSELING AND COORDINATION OF CARE   MEDICATION MANAGEMENT    Return in about 4 months (around 12/07/2024) for Recheck AK face, recheck bx.  I, Grayce Saunas, RMA, am acting as scribe for Alm Rhyme, MD .   Documentation: I have reviewed the above documentation for accuracy and completeness, and I agree with the above.  Alm Rhyme, MD

## 2024-08-11 ENCOUNTER — Encounter: Payer: Self-pay | Admitting: Family Medicine

## 2024-08-11 NOTE — Telephone Encounter (Signed)
 Moved message to patient chart and sent to triage to reach out.

## 2024-08-15 ENCOUNTER — Encounter: Payer: Self-pay | Admitting: Dermatology

## 2024-08-16 ENCOUNTER — Ambulatory Visit: Payer: Self-pay | Admitting: Dermatology

## 2024-08-16 DIAGNOSIS — C4352 Malignant melanoma of skin of breast: Secondary | ICD-10-CM

## 2024-08-16 LAB — SURGICAL PATHOLOGY

## 2024-08-17 NOTE — Telephone Encounter (Addendum)
 Referral sent to Dr. 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 Hester Bott Testing Done    ----- Message from Alm Hester sent at 08/16/2024  5:41 PM EST ----- FINAL DIAGNOSIS       1. Skin, left medial pectoral :      MALIGNANT MELANOMA      MELANOMA TABLE (AJCC 8TH EDITION#)      SPECIMEN ANATOMIC SITE:  LEFT MEDIAL PECTORAL      HISTOLOGIC TYPE:   SUPERFICIAL SPREADING      BRESLOW'S DEPTH/MAXIMUM TUMOR THICKNESS: 0.3 MM      CLARK/ANATOMIC LEVEL: II      MARGINS      PERIPHERAL MARGINS:   INVOLVED      DEEP MARGIN:   NARROWLY FREE      ULCERATION:  ABSENT      SATELLITOSIS:  ABSENT      MITOTIC INDEX:    LESS THAN 1 PER MM/2      LYMPHO-VASCULAR INVASION:   ABSENT      NEUROTROPISM:  ABSENT      TUMOR-INFILTRATING LYMPHOCYTES:   NON-BRISK      TUMOR REGRESSION:   ABSENT      LYMPH NODES (IF APPLICABLE):   N/A      PATHOLOGIC STAGE: PT1A      COMMENT:  A COMPLETE RE-EXCISION IS RECOMMENDED.      # AJCC 8TH EDITION:  PT1A <0.8 W/O ULCER; PT1B <0.8 W/ ULCER OR 0.8-1.0 WITH OR      W/O ULCER; PT2A >1.0-2.0 W/OUT ULCER; PT2B >1.0-2.0 W/ ULCER; PT3A >2.0-4.0 W/O      ULCER; PT3B >2.0-4.0 W/ ULCER; PT4A >4.0 W/O ULCER; PT4B: >4.0 W/ ULCER. (AJCC      MELANOMA EXPERT PANEL: [place] [facility].2017 OCT 13)   Cancer = Malignant Melanoma - Invasive Thin Melanoma Breslow 0.3 millimeter Mitoses less than 1 per millimeter squared No lymphovascular invasion  Needs Wide Local Excision - schedule for surgery Will order [facility] testing. Schedule for close follow up = 3 mos appt for TBSE  Called and discussed all above with pt by phone today  Pt is awaiting call for surgery scheduling  And 3 mos follow up.  ----- Message ----- From: Interface, Lab In Three Zero One Sent: 08/16/2024   5:20 PM EST To: Alm JAYSON Hester, MD

## 2024-09-04 DIAGNOSIS — Z23 Encounter for immunization: Secondary | ICD-10-CM | POA: Diagnosis not present

## 2024-09-06 ENCOUNTER — Ambulatory Visit: Payer: Self-pay | Admitting: Dermatology

## 2024-09-06 ENCOUNTER — Encounter: Payer: Self-pay | Admitting: Dermatology

## 2024-09-07 ENCOUNTER — Ambulatory Visit: Admitting: Anesthesiology

## 2024-09-07 ENCOUNTER — Encounter: Payer: Self-pay | Admitting: Gastroenterology

## 2024-09-07 ENCOUNTER — Other Ambulatory Visit: Payer: Self-pay

## 2024-09-07 ENCOUNTER — Ambulatory Visit
Admission: RE | Admit: 2024-09-07 | Discharge: 2024-09-07 | Disposition: A | Attending: Gastroenterology | Admitting: Gastroenterology

## 2024-09-07 ENCOUNTER — Encounter
Admission: RE | Disposition: A | Discharge status: Home / Self Care | Payer: Self-pay | Source: Home / Self Care | Attending: Gastroenterology

## 2024-09-07 DIAGNOSIS — Z1211 Encounter for screening for malignant neoplasm of colon: Secondary | ICD-10-CM | POA: Diagnosis present

## 2024-09-07 DIAGNOSIS — K5730 Diverticulosis of large intestine without perforation or abscess without bleeding: Secondary | ICD-10-CM | POA: Diagnosis not present

## 2024-09-07 HISTORY — PX: COLONOSCOPY: SHX5424

## 2024-09-07 SURGERY — COLONOSCOPY
Anesthesia: General

## 2024-09-07 MED ORDER — SODIUM CHLORIDE 0.9 % IV SOLN: INTRAVENOUS | Status: DC

## 2024-09-07 MED ORDER — PHENYLEPHRINE 80 MCG/ML (10ML) SYRINGE FOR IV PUSH (FOR BLOOD PRESSURE SUPPORT)
PREFILLED_SYRINGE | INTRAVENOUS | Status: AC
  Filled 2024-09-07: qty 10

## 2024-09-07 MED ORDER — LIDOCAINE HCL (CARDIAC) PF 100 MG/5ML IV SOSY
PREFILLED_SYRINGE | INTRAVENOUS | Status: DC | PRN
  Administered 2024-09-07: 60 mg via INTRAVENOUS

## 2024-09-07 MED ORDER — EPHEDRINE SULFATE-NACL 50-0.9 MG/10ML-% IV SOSY
PREFILLED_SYRINGE | INTRAVENOUS | Status: DC | PRN
  Administered 2024-09-07: 10 mg via INTRAVENOUS
  Administered 2024-09-07: 15 mg via INTRAVENOUS

## 2024-09-07 MED ORDER — LIDOCAINE HCL (PF) 2 % IJ SOLN
INTRAMUSCULAR | Status: AC
  Filled 2024-09-07: qty 5

## 2024-09-07 MED ORDER — PROPOFOL 500 MG/50ML IV EMUL
INTRAVENOUS | Status: DC | PRN
  Administered 2024-09-07: 75 ug/kg/min via INTRAVENOUS

## 2024-09-07 MED ORDER — PROPOFOL 10 MG/ML IV BOLUS
INTRAVENOUS | Status: DC | PRN
  Administered 2024-09-07: 50 mg via INTRAVENOUS

## 2024-09-07 MED ORDER — DEXMEDETOMIDINE HCL IN NACL 80 MCG/20ML IV SOLN
INTRAVENOUS | Status: DC | PRN
  Administered 2024-09-07: 8 ug via INTRAVENOUS
  Administered 2024-09-07: 12 ug via INTRAVENOUS

## 2024-09-07 NOTE — Telephone Encounter (Signed)
 Discussed [facility] testing with patient  Keep scheduled appt with Dr Corey 09/20/24

## 2024-09-07 NOTE — Transfer of Care (Signed)
 Immediate Anesthesia Transfer of Care Note  Patient: Craig Jackson  Procedure(s) Performed: COLONOSCOPY  Patient Location: PACU  Anesthesia Type:General  Level of Consciousness: sedated  Airway & Oxygen Therapy: Patient Spontanous Breathing  Post-op Assessment: Report given to RN and Post -op Vital signs reviewed and stable  Post vital signs: Reviewed and stable  Last Vitals:  Vitals Value Taken Time  BP    Temp    Pulse    Resp    SpO2      Last Pain:  Vitals:   09/07/24 0839  PainSc: 0-No pain         Complications: No notable events documented.

## 2024-09-07 NOTE — H&P (Signed)
 Ruel Kung , MD [address], [address], [place], [place], [address] Phone: [phone] Fax: [phone]  Primary Care Physician:  Jimmy Charlie FERNS, MD   Pre-Procedure History & Physical: HPI:  Craig Jackson is a 73 y.o. male is here for an colonoscopy.   Past Medical History:  Diagnosis Date   Arthritis    BPH with obstruction/lower urinary tract symptoms    Elevated PSA    Generalized osteoarthritis of multiple sites    mostly hands   GERD (gastroesophageal reflux disease)    Gout     Past Surgical History:  Procedure Laterality Date   APPENDECTOMY     CATARACT EXTRACTION     EYE SURGERY  2016   detached retina   ROTATOR CUFF REPAIR     SHOULDER SURGERY Left 1972   separation    Prior to Admission medications   Medication Sig Start Date End Date Taking? Authorizing Provider  omeprazole  (PRILOSEC) 20 MG capsule TAKE 1 CAPSULE BY MOUTH EVERY DAY 11/12/23  Yes Jimmy Charlie FERNS, MD  tamsulosin  (FLOMAX ) 0.4 MG CAPS capsule TAKE 1 CAPSULE BY MOUTH EVERY DAY 11/11/23  Yes Jimmy Charlie FERNS, MD    Allergies as of 07/04/2024   (No Known Allergies)    Family History  Problem Relation Age of Onset   Arthritis Mother    Atrial fibrillation Father    Depression Sister        from chronic pain   Arthritis Sister    Dementia Sister    Heart disease Neg Hx    Diabetes Neg Hx    Cancer Neg Hx     Social History   Socioeconomic History   Marital status: Married    Spouse name: Not on file   Number of children: 1   Years of education: Not on file   Highest education level: Not on file  Occupational History   Occupation: Scientist, research (physical sciences)    Comment: retired  Tobacco Use   Smoking status: Never    Passive exposure: Never   Smokeless tobacco: Former    Types: Chew    Quit date: 09/03/2017   Tobacco comments:    only  occasionally  Vaping Use   Vaping status: Never Used  Substance and Sexual Activity   Alcohol use: Yes    Alcohol/week: 2.0 standard drinks of alcohol    Types: 2 Cans of beer per week   Drug use: No   Sexual activity: Never  Other Topics Concern   Not on file  Social History Narrative   1 daughter---local   State graduate      Has living will   Wife is health care POA--daughter is alternate   Would accept resuscitation   Not sure about feeding tube--but certainly not prolonged    Social Drivers of Corporate Investment Banker Strain: Not on file  Food Insecurity: Not on file  Transportation Needs: Not on file  Physical Activity: Not on file  Stress: Not on file  Social Connections: Not on file  Intimate Partner Violence: Not on file    Review of Systems: See HPI, otherwise negative ROS  Physical Exam: BP (!) 137/97   Pulse 64   Temp (!) 96.8 F (36 C)   Resp 16   Ht 5' 11 (1.803 m)   Wt 81.2 kg   SpO2 100%   BMI 24.97 kg/m  General:   Alert,  pleasant and cooperative in NAD Head:  Normocephalic and atraumatic.  Neck:  Supple; no masses or thyromegaly. Lungs:  Clear throughout to auscultation, normal respiratory effort.    Heart:  +S1, +S2, Regular rate and rhythm, No edema. Abdomen:  Soft, nontender and nondistended. Normal bowel sounds, without guarding, and without rebound.   Neurologic:  Alert and  oriented x4;  grossly normal neurologically.  Impression/Plan: Craig Jackson is here for an colonoscopy to be performed for Screening colonoscopy average risk   Risks, benefits, limitations, and alternatives regarding  colonoscopy have been reviewed with the patient.  Questions have been answered.  All parties agreeable.   Ruel Kung, MD  09/07/2024, 8:45 AM

## 2024-09-07 NOTE — Telephone Encounter (Signed)
-----   Message from Alm Rhyme sent at 09/06/2024 11:38 AM EST ----- Tressa test 08/2024 on melanoma of L medial pectoral shows: Class 1A = Lowest risk of recurrence / metastasis over 5 years.  (Pt scheduled with Dr Paci 09/20/24 for surgery)  Continue current plan for treatment and follow up ----- Message ----- From: Interface, Scanned Link In One Three One Sent: 09/06/2024   8:32 AM EST To: Alm JAYSON Rhyme, MD

## 2024-09-07 NOTE — Anesthesia Postprocedure Evaluation (Signed)
 Anesthesia Post Note  Patient: [name]  Procedure(s) Performed: COLONOSCOPY  Patient location during evaluation: Endoscopy Anesthesia Type: General Level of consciousness: awake and alert Pain management: pain level controlled Vital Signs Assessment: post-procedure vital signs reviewed and stable Respiratory status: spontaneous breathing, nonlabored ventilation and respiratory function stable Cardiovascular status: blood pressure returned to baseline and stable Postop Assessment: no apparent nausea or vomiting Anesthetic complications: no   No notable events documented.   Last Vitals:  Vitals:   09/07/24 1008 09/07/24 1014  BP: 105/76 122/74  Pulse: 74 71  Resp: 13 16  Temp:    SpO2: 100% 100%    Last Pain:  Vitals:   09/07/24 1008  TempSrc:   PainSc: 0-No pain                 Camellia Merilee Louder

## 2024-09-07 NOTE — Anesthesia Preprocedure Evaluation (Addendum)
 Anesthesia Evaluation  Patient identified by MRN, date of birth, ID band Patient awake    Reviewed: Allergy & Precautions, H&P , NPO status , Patient's Chart, lab work & pertinent test results  Airway Mallampati: II  TM Distance: >3 FB Neck ROM: full    Dental no notable dental hx.    Pulmonary neg pulmonary ROS   Pulmonary exam normal        Cardiovascular negative cardio ROS Normal cardiovascular exam     Neuro/Psych negative neurological ROS  negative psych ROS   GI/Hepatic Neg liver ROS,GERD  ,,  Endo/Other  negative endocrine ROS    Renal/GU negative Renal ROS  negative genitourinary   Musculoskeletal   Abdominal   Peds  Hematology negative hematology ROS (+)   Anesthesia Other Findings Past Medical History: No date: Arthritis No date: BPH with obstruction/lower urinary tract symptoms No date: Elevated PSA No date: Generalized osteoarthritis of multiple sites     Comment:  mostly hands No date: GERD (gastroesophageal reflux disease) No date: Gout  Past Surgical History: No date: APPENDECTOMY No date: CATARACT EXTRACTION 2016: EYE SURGERY     Comment:  detached retina No date: ROTATOR CUFF REPAIR 1972: SHOULDER SURGERY; Left     Comment:  separation     Reproductive/Obstetrics negative OB ROS                              Anesthesia Physical Anesthesia Plan  ASA: 2  Anesthesia Plan: General   Post-op Pain Management:    Induction: Intravenous  PONV Risk Score and Plan: Propofol infusion and TIVA  Airway Management Planned: Natural Airway  Additional Equipment:   Intra-op Plan:   Post-operative Plan:   Informed Consent: I have reviewed the patients History and Physical, chart, labs and discussed the procedure including the risks, benefits and alternatives for the proposed anesthesia with the patient or authorized representative who has indicated his/her  understanding and acceptance.     Dental Advisory Given  Plan Discussed with: CRNA and Surgeon  Anesthesia Plan Comments:          Anesthesia Quick Evaluation

## 2024-09-07 NOTE — Op Note (Signed)
 [facility] Patient Name: Craig Jackson Procedure Date: 09/07/2024 9:21 AM MRN: [id number] Account #: [id number] Date of Birth: [birth year] Admit Type: Outpatient Age: 73 Room: [facility] ENDO ROOM 1 Gender: Male Note Status: Finalized Instrument Name: Colon Scope [id number] Procedure:             Colonoscopy Indications:           Screening for colorectal malignant neoplasm Providers:             Ruel Kung MD, MD Referring MD:          Charlie CHANETA Denise (Referring MD) Medicines:             Monitored Anesthesia Care Complications:         No immediate complications. Procedure:             Pre-Anesthesia Assessment:                        - Prior to the procedure, a History and Physical was                         performed, and patient medications, allergies and                         sensitivities were reviewed. The patient's tolerance                         of previous anesthesia was reviewed.                        - The risks and benefits of the procedure and the                         sedation options and risks were discussed with the                         patient. All questions were answered and informed                         consent was obtained.                        - ASA Grade Assessment: II - A patient with mild                         systemic disease.                        After obtaining informed consent, the colonoscope was                         passed under direct vision. Throughout the procedure,                         the patient's blood pressure, pulse, and oxygen                         saturations were monitored continuously. The                         Colonoscope was  introduced through the anus and                         advanced to the the cecum, identified by the                         appendiceal orifice. The colonoscopy was performed                         with ease. The patient tolerated the procedure well.                          The quality of the bowel preparation was good. The                         ileocecal valve, appendiceal orifice, and rectum were                         photographed. Findings:      The perianal and digital rectal examinations were normal.      Multiple medium-mouthed diverticula were found in the entire colon.      The exam was otherwise without abnormality on direct and retroflexion       views. Impression:            - Diverticulosis in the entire examined colon.                        - The examination was otherwise normal on direct and                         retroflexion views.                        - No specimens collected. Recommendation:        - Discharge patient to home (with escort).                        - Resume previous diet.                        - Continue present medications.                        - Repeat colonoscopy is not recommended due to current                         age (44 years or older) for screening purposes. Procedure Code(s):     --- Professional ---                        [phone], Colonoscopy, flexible; diagnostic, including                         collection of specimen(s) by brushing or washing, when                         performed (separate procedure) Diagnosis Code(s):     --- Professional ---  Z12.11, Encounter for screening for malignant neoplasm                         of colon                        K57.30, Diverticulosis of large intestine without                         perforation or abscess without bleeding CPT copyright 2022 American Medical Association. All rights reserved. The codes documented in this report are preliminary and upon coder review may  be revised to meet current compliance requirements. Ruel Kung, MD Ruel Kung MD, MD 09/07/2024 9:59:07 AM This report has been signed electronically. Number of Addenda: 0 Note Initiated On: 09/07/2024 9:21 AM Scope Withdrawal Time: 0 hours  10 minutes 25 seconds  Total Procedure Duration: 0 hours 16 minutes 35 seconds  Estimated Blood Loss:  Estimated blood loss: none.      [facility]

## 2024-09-08 ENCOUNTER — Encounter: Payer: Self-pay | Admitting: Family Medicine

## 2024-09-20 ENCOUNTER — Encounter: Payer: Self-pay | Admitting: Dermatology

## 2024-09-20 ENCOUNTER — Ambulatory Visit (INDEPENDENT_AMBULATORY_CARE_PROVIDER_SITE_OTHER): Admitting: Dermatology

## 2024-09-20 VITALS — BP 125/81 | HR 69 | Temp 97.6°F

## 2024-09-20 DIAGNOSIS — L814 Other melanin hyperpigmentation: Secondary | ICD-10-CM | POA: Diagnosis not present

## 2024-09-20 DIAGNOSIS — C4359 Malignant melanoma of other part of trunk: Secondary | ICD-10-CM

## 2024-09-20 DIAGNOSIS — L578 Other skin changes due to chronic exposure to nonionizing radiation: Secondary | ICD-10-CM | POA: Diagnosis not present

## 2024-09-20 DIAGNOSIS — C439 Malignant melanoma of skin, unspecified: Secondary | ICD-10-CM

## 2024-09-20 NOTE — Patient Instructions (Signed)

## 2024-09-20 NOTE — Progress Notes (Signed)
 Follow-Up Visit   Subjective  Craig Jackson is a 73 y.o. 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 Hester.  The following portions of the chart were reviewed this encounter and updated as appropriate: medications, allergies, medical history  Review of Systems:  No other skin or systemic complaints except as noted in HPI or Assessment and Plan.  Objective  Well appearing patient in no apparent distress; mood and affect are within normal limits.  A focused examination was performed of the following areas: Left medial pectoral Relevant physical exam findings are noted in the Assessment and Plan.   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 No    Anesthesia: Anesthesia method: local infiltration Local anesthetic: lidocaine  1% WITH epi  Procedure Details: Timeout: pre-procedure verification complete Procedure Prep: patient was prepped and draped in usual sterile fashion Prep type: chlorhexidine Biopsy accession number: [id number] Pre-Op diagnosis: melanoma Melanoma subtype: invasive MohsAIQ Surgical site (if tumor spans multiple areas, please select predominant area): trunk (excluding nipple/areola) Surgery side: left Surgical site (from skin exam): left medial pectoral Pre-operative length (cm): 0.8 Pre-operative width (cm): 0.8 Indications for Mohs surgery: anatomic location where tissue conservation is critical and aggressive histology  Micrographic Surgery Details: Post-operative length (cm): 3 Post-operative width (cm): 2.7 Number of Mohs stages: 2  Stage 1    Tumor features identified on Mohs section: melanoma  Stage 2    Tumor features identified on Mohs section: no tumor  identified  Reconstruction: Was the defect reconstructed? Yes   Was reconstruction performed by the same Mohs surgeon? Yes   Setting of reconstruction: outpatient office When was reconstruction performed? same day Type of reconstruction: linear Linear reconstruction: complex  - Skin repair Complexity:  Complex Final length (cm):  8 Informed consent: discussed and consent obtained   Timeout: patient name, date of birth, surgical site, and procedure verified   Procedure prep:  Patient was prepped and draped in usual sterile fashion Prep type:  Chlorhexidine Anesthesia: the lesion was anesthetized in a standard fashion   Anesthetic:  1% lidocaine  w/ epinephrine  1-100,000 buffered w/ 8.4% NaHCO3 Reason for type of repair: allow closure of the large defect and preserve normal anatomy   Undermining: area extensively undermined   Subcutaneous layers (deep stitches):  Suture size:  3-0 Suture type: Monocryl (poliglecaprone 25)   Stitches:  Buried vertical mattress Fine/surface layer approximation (top stitches):  Suture type: cyanoacrylate tissue glue   Hemostasis achieved with: pressure and Gelfoam Outcome: patient tolerated procedure well with no complications   Post-procedure details: sterile dressing applied and wound care instructions given   Dressing type: bandage and pressure dressing      Return in about 4 weeks (around 10/18/2024) for wound check.  I, Doyce Pan, CMA, am acting as scribe for RUFUS CHRISTELLA HOLY, MD.    09/20/2024  HISTORY OF PRESENT ILLNESS  Craig Jackson is seen in consultation at the request of Dr. Hester for biopsy-proven Invasive Melanoma of the left pectoral chest (0.3 mm). They note that the area has been present for about 6 months increasing in size with time.  There is no history of previous treatment.  Reports no other new or changing lesions and has no other complaints today.  Medications  and allergies: see patient chart.  Review of  systems: Reviewed 8 systems and notable for the above skin cancer.  All other systems reviewed are unremarkable/negative, unless noted in the HPI. Past medical history, surgical history, family history, social history were also reviewed and are noted in the chart/questionnaire.    PHYSICAL EXAMINATION  General: Well-appearing, in no acute distress, alert and oriented x 4. Vitals reviewed in chart (if available).   Skin: Exam reveals a 0.8 x 0.8 cm erythematous papule and biopsy scar on the left pectoral chest. There are rhytids, telangiectasias, and lentigines, consistent with photodamage. Lymph nodes: No cervical, axillary or inguinal lymphadenopathy.  Biopsy report(s) reviewed, confirming the diagnosis.   ASSESSMENT  1) Invasive Melanoma on the left pectoral chest 2) photodamage 3) solar lentigines   PLAN   1. Due to location, size, histology, or recurrence and the likelihood of subclinical extension as well as the need to conserve normal surrounding tissue, the patient was deemed acceptable for Mohs micrographic surgery (MMS).  The nature and purpose of the procedure, associated benefits and risks including recurrence and scarring, possible complications such as pain, infection, and bleeding, and alternative methods of treatment if appropriate were discussed with the patient during consent. The lesion location was verified by the patient, by reviewing previous notes, pathology reports, and by photographs as well as angulation measurements if available.  Informed consent was reviewed and signed by the patient, and timeout was performed at 8:15 AM. See op note below.  2. For the photodamage and solar lentigines, sun protection discussed/information given on OTC sunscreens, and we recommend continued regular follow-up with primary dermatologist every 6 months or sooner for any growing, bleeding, or changing lesions. 3. Prognosis and future surveillance discussed. 4. Letter with treatment  outcome sent to referring provider. 5. Pain acetaminophen/ibuprofen   MOHS MICROGRAPHIC SURGERY AND RECONSTRUCTION  Initial size:   0.8 x 0.8 cm Surgical defect/wound size: 3.0 x 2.7 cm Anesthesia:    0.33% lidocaine  with 1:200,000 epinephrine  EBL:    <5 mL Complications:  None Repair type:   Complex SQ suture:   5-0 Monocryl Cutaneous suture:  6-0 Plain gut Final size of the repair: 8.0 cm  Stages: 2  STAGE I: Anesthesia achieved with 0.5% lidocaine  with 1:200,000 epinephrine . ChloraPrep applied. 1 section(s) excised using Mohs technique (this includes total peripheral and deep tissue margin excision and evaluation with frozen sections, excised and interpreted by the same physician). The tumor was first debulked and then excised with an approx. 2 mm margin.  Hemostasis was achieved with electrocautery as needed.  The specimen was then oriented, subdivided/relaxed, inked, and processed using Mohs technique.   Tissue was stained with H&E and MART-1 with 1 chromogen.   Frozen section analysis revealed a positive margin for atypical proliferation of melanocytes arranged in a confluent and pagetoid pattern along the basal and suprabasal layers. The melanocytes exhibit nuclear pleomorphism, hyperchromasia, and prominent nucleoli. There is an increased number of melanocytes at the dermoepidermal junction with focal nesting and single-cell spread. No evidence of dermal invasion is seen in the peripheral margin.    STAGE II: An additional 2 mm margin was excised.  Hemostasis was achieved with electrocautery as needed.  The specimen was then oriented, subdivided/relaxed, inked, and processed using Mohs technique. Evaluation of slides by the Mohs surgeon revealed clear tumor margins.  Tissue was stained with H&E and MART-1 with 1 chromogen.   Reconstruction  The surgical wound was then cleaned, prepped, and re-anesthetized as above.  Wound edges were undermined extensively along at least one entire  edge and at a distance equal to or greater than the width of the defect (see wound defect size above) in order to achieve closure and decrease wound tension and anatomic distortion. Redundant tissue repair including standing cone removal was performed. Hemostasis was achieved with electrocautery. Subcutaneous and epidermal tissues were approximated with the above sutures. The surgical site was then lightly scrubbed with sterile, saline-soaked gauze. Steri-strips were applied, and the area was then bandaged using Vaseline ointment, non-adherent gauze, gauze pads, and tape to provide an adequate pressure dressing. The patient tolerated the procedure well, was given detailed written and verbal wound care instructions, and was discharged in good condition.   The patient will follow-up: 4 weeks.   Documentation: I have reviewed the above documentation for accuracy and completeness, and I agree with the above.  RUFUS CHRISTELLA HOLY, MD

## 2024-10-23 ENCOUNTER — Encounter: Payer: Self-pay | Admitting: Dermatology

## 2024-10-23 ENCOUNTER — Ambulatory Visit: Admitting: Dermatology

## 2024-10-23 DIAGNOSIS — L905 Scar conditions and fibrosis of skin: Secondary | ICD-10-CM | POA: Diagnosis not present

## 2024-10-23 DIAGNOSIS — Z85820 Personal history of malignant melanoma of skin: Secondary | ICD-10-CM | POA: Diagnosis not present

## 2024-10-23 DIAGNOSIS — C439 Malignant melanoma of skin, unspecified: Secondary | ICD-10-CM

## 2024-10-23 NOTE — Patient Instructions (Addendum)

## 2024-10-23 NOTE — Progress Notes (Signed)
" ° °  Follow Up Visit   Subjective  Craig Jackson is a 74 y.o. male who presents for the following: follow up from Mohs surgery   The patient presents for follow up from Mohs surgery for a MM on the left medial pectoral chest, treated on 09/20/24, repaired with linear closure. The patient has been bandaging the wound as directed. The endorse the following concerns: none  The following portions of the chart were reviewed this encounter and updated as appropriate: medications, allergies, medical history  Review of Systems:  No other skin or systemic complaints except as noted in HPI or Assessment and Plan.  Objective  Well appearing patient in no apparent distress; mood and affect are within normal limits.  A focal examination was performed including scalp, head, face and left medial pectoral. All findings within normal limits unless otherwise noted below.  Healing wound with mild erythema  Relevant physical exam findings are noted in the Assessment and Plan.    Assessment & Plan   Scar s/p Mohs for MM (0.3 mm) on the right pectoral chest, treated on 09/20/24, repaired with linear closure - Reassured that wound is healing well - Discussed that scars take up to 12 months to mature from the date of surgery - Recommend SPF 30+ to scar daily to prevent purple color from UV exposure during scar maturation process - Discussed that erythema and raised appearance of scar will fade over the next 4-6 months - OK to start scar massage at 4-6 weeks post-op - Can consider silicone based products for scar healing starting at 6 weeks post-op  HISTORY OF MELANOMA - No evidence of recurrence today - No lymphadenopathy - Recommend regular full body skin exams - Recommend daily broad spectrum sunscreen SPF 30+ to sun-exposed areas, reapply every 2 hours as needed.  - Call if any new or changing lesions are noted between office visits  Return if symptoms worsen or fail to improve.  I, Darice Smock, CMA, am acting as scribe for RUFUS CHRISTELLA HOLY, MD.   Documentation: I have reviewed the above documentation for accuracy and completeness, and I agree with the above.  RUFUS CHRISTELLA HOLY, MD  "

## 2024-10-27 ENCOUNTER — Other Ambulatory Visit: Payer: Self-pay | Admitting: Family Medicine

## 2024-10-27 DIAGNOSIS — M1000 Idiopathic gout, unspecified site: Secondary | ICD-10-CM

## 2024-10-27 DIAGNOSIS — Z125 Encounter for screening for malignant neoplasm of prostate: Secondary | ICD-10-CM

## 2024-10-27 DIAGNOSIS — Z13220 Encounter for screening for lipoid disorders: Secondary | ICD-10-CM

## 2024-10-27 DIAGNOSIS — R972 Elevated prostate specific antigen [PSA]: Secondary | ICD-10-CM

## 2024-11-06 ENCOUNTER — Other Ambulatory Visit: Payer: Medicare Other

## 2024-11-08 ENCOUNTER — Ambulatory Visit (INDEPENDENT_AMBULATORY_CARE_PROVIDER_SITE_OTHER)

## 2024-11-08 ENCOUNTER — Encounter: Payer: Self-pay | Admitting: Podiatry

## 2024-11-08 ENCOUNTER — Ambulatory Visit: Admitting: Podiatry

## 2024-11-08 DIAGNOSIS — M7752 Other enthesopathy of left foot: Secondary | ICD-10-CM

## 2024-11-08 DIAGNOSIS — M21612 Bunion of left foot: Secondary | ICD-10-CM

## 2024-11-08 DIAGNOSIS — M21619 Bunion of unspecified foot: Secondary | ICD-10-CM

## 2024-11-08 MED ORDER — TRIAMCINOLONE ACETONIDE 40 MG/ML IJ SUSP
20.0000 mg | Freq: Once | INTRAMUSCULAR | Status: AC
  Administered 2024-11-08: 20 mg

## 2024-11-08 NOTE — Progress Notes (Signed)
 "  Subjective:  Patient ID: Craig Jackson, male    DOB: [birth year],  MRN: [id number] HPI Chief Complaint  Patient presents with   Toe Pain      New pt- left foot big toe having issues and pain     74 y.o. male presents with the above complaint.   ROS: Denies fever chills nausea vomiting muscle aches pains calf pain back pain chest pain shortness of breath.  Past Medical History:  Diagnosis Date   Arthritis    BPH with obstruction/lower urinary tract symptoms    Elevated PSA    Generalized osteoarthritis of multiple sites    mostly hands   GERD (gastroesophageal reflux disease)    Gout    Melanoma (HCC) 08/09/2024   L medial pectoral, BRESLOW'S: 0.3 MM CLARK/ANATOMIC LEVEL: II, [place] test 08/2024 Class 1A, referral sent to Dr. Corey   Past Surgical History:  Procedure Laterality Date   APPENDECTOMY     CATARACT EXTRACTION     COLONOSCOPY N/A 09/07/2024   Procedure: COLONOSCOPY;  Surgeon: Therisa Bi, MD;  Location: [facility] ENDOSCOPY;  Service: Gastroenterology;  Laterality: N/A;   EYE SURGERY  2016   detached retina   ROTATOR CUFF REPAIR     SHOULDER SURGERY Left 1972   separation   Current Medications[1]  Allergies[2] Review of Systems Objective:  There were no vitals filed for this visit.  General: Well developed, nourished, in no acute distress, alert and oriented x3   Dermatological: Skin is warm, dry and supple bilateral. Nails x 10 are well maintained; remaining integument appears unremarkable at this time. There are no open sores, no preulcerative lesions, no rash or signs of infection present.  Vascular: Dorsalis Pedis artery and Posterior Tibial artery pedal pulses are 2/4 bilateral with immedate capillary fill time. Pedal hair growth present. No varicosities and no lower extremity edema present bilateral.   Neruologic: Grossly intact via light touch bilateral. Vibratory intact via tuning fork bilateral. Protective threshold with Semmes Wienstein  monofilament intact to all pedal sites bilateral. Patellar and Achilles deep tendon reflexes 2+ bilateral. No Babinski or clonus noted bilateral.   Musculoskeletal: No gross boney pedal deformities bilateral. No pain, crepitus, or limitation noted with foot and ankle range of motion bilateral. Muscular strength 5/5 in all groups tested bilateral.  Hallux abductovalgus deformity of the left foot with pain on palpation and end range of motion of the joint.  Also has limited range of motion secondary to hallux limitus.  Gait: Unassisted, Nonantalgic.    Radiographs:  Joint space narrowing subchondral sclerosis eburnation with severe hallux valgus deformity and hypertrophic medial condyle formation.  Minimal spurring dorsally  Assessment & Plan:   Assessment: Hallux abductovalgus deformity with osteoarthritis synovitis.  Plan: Discussed the pros and cons of surgical intervention as well as injection therapy.  He would like to try the injection therapy prior to any type of fusion or joint replacement.  After sterile Betadine skin prep I injected 20 mg of Kenalog  and local anesthetic into the joint space.  He tolerated procedure well without complications.  I will follow-up with him on an as needed basis.     Porshia Blizzard T. Ciel Yanes, DPM    [1]  Current Outpatient Medications:    omeprazole  (PRILOSEC) 20 MG capsule, TAKE 1 CAPSULE BY MOUTH EVERY DAY (Patient not taking: Reported on 09/20/2024), Disp: 90 capsule, Rfl: 3   tamsulosin  (FLOMAX ) 0.4 MG CAPS capsule, TAKE 1 CAPSULE BY MOUTH EVERY DAY, Disp: 90 capsule, Rfl: 3 [  2] No Known Allergies  "

## 2024-11-09 ENCOUNTER — Encounter: Payer: Self-pay | Admitting: Family Medicine

## 2024-11-09 ENCOUNTER — Ambulatory Visit: Payer: Self-pay | Admitting: Family Medicine

## 2024-11-09 ENCOUNTER — Other Ambulatory Visit

## 2024-11-09 DIAGNOSIS — Z125 Encounter for screening for malignant neoplasm of prostate: Secondary | ICD-10-CM

## 2024-11-09 DIAGNOSIS — Z13220 Encounter for screening for lipoid disorders: Secondary | ICD-10-CM

## 2024-11-09 DIAGNOSIS — M1000 Idiopathic gout, unspecified site: Secondary | ICD-10-CM

## 2024-11-09 DIAGNOSIS — R972 Elevated prostate specific antigen [PSA]: Secondary | ICD-10-CM

## 2024-11-09 LAB — COMPREHENSIVE METABOLIC PANEL WITH GFR
ALT: 10 U/L (ref 3–53)
AST: 10 U/L (ref 5–37)
Albumin: 4.5 g/dL (ref 3.5–5.2)
Alkaline Phosphatase: 71 U/L (ref 39–117)
BUN: 15 mg/dL (ref 6–23)
CO2: 30 meq/L (ref 19–32)
Calcium: 9.8 mg/dL (ref 8.4–10.5)
Chloride: 104 meq/L (ref 96–112)
Creatinine, Ser: 0.95 mg/dL (ref 0.40–1.50)
GFR: 78.52 mL/min
Glucose, Bld: 100 mg/dL — ABNORMAL HIGH (ref 70–99)
Potassium: 4.2 meq/L (ref 3.5–5.1)
Sodium: 141 meq/L (ref 135–145)
Total Bilirubin: 1 mg/dL (ref 0.2–1.2)
Total Protein: 6.9 g/dL (ref 6.0–8.3)

## 2024-11-09 LAB — LIPID PANEL
Cholesterol: 155 mg/dL (ref 28–200)
HDL: 62.8 mg/dL
LDL Cholesterol: 79 mg/dL (ref 10–99)
NonHDL: 91.83
Total CHOL/HDL Ratio: 2
Triglycerides: 64 mg/dL (ref 10.0–149.0)
VLDL: 12.8 mg/dL (ref 0.0–40.0)

## 2024-11-09 LAB — CBC WITH DIFFERENTIAL/PLATELET
Basophils Absolute: 0 10*3/uL (ref 0.0–0.1)
Basophils Relative: 0.2 % (ref 0.0–3.0)
Eosinophils Absolute: 0 10*3/uL (ref 0.0–0.7)
Eosinophils Relative: 0.1 % (ref 0.0–5.0)
HCT: 36.6 % — ABNORMAL LOW (ref 39.0–52.0)
Hemoglobin: 12 g/dL — ABNORMAL LOW (ref 13.0–17.0)
Lymphocytes Relative: 18.4 % (ref 12.0–46.0)
Lymphs Abs: 1.6 10*3/uL (ref 0.7–4.0)
MCHC: 32.9 g/dL (ref 30.0–36.0)
MCV: 85.8 fl (ref 78.0–100.0)
Monocytes Absolute: 0.9 10*3/uL (ref 0.1–1.0)
Monocytes Relative: 9.8 % (ref 3.0–12.0)
Neutro Abs: 6.3 10*3/uL (ref 1.4–7.7)
Neutrophils Relative %: 71.5 % (ref 43.0–77.0)
Platelets: 272 10*3/uL (ref 150.0–400.0)
RBC: 4.26 Mil/uL (ref 4.22–5.81)
RDW: 18.1 % — ABNORMAL HIGH (ref 11.5–15.5)
WBC: 8.7 10*3/uL (ref 4.0–10.5)

## 2024-11-09 LAB — PSA, MEDICARE: PSA: 10.97 ng/mL — ABNORMAL HIGH (ref 0.10–4.00)

## 2024-11-09 LAB — URIC ACID: Uric Acid, Serum: 6.4 mg/dL (ref 4.0–7.8)

## 2024-11-09 MED ORDER — TAMSULOSIN HCL 0.4 MG PO CAPS: 0.4000 mg | ORAL_CAPSULE | Freq: Every day | ORAL | 0 refills | Status: AC

## 2024-11-13 ENCOUNTER — Encounter: Payer: Medicare Other | Admitting: Family Medicine

## 2024-11-20 ENCOUNTER — Ambulatory Visit: Admitting: Dermatology

## 2024-12-14 ENCOUNTER — Ambulatory Visit

## 2024-12-14 ENCOUNTER — Ambulatory Visit: Admitting: Dermatology
